# Patient Record
Sex: Male | Born: 1950 | Race: White | Hispanic: No | Marital: Married | Smoking: Never smoker
Health system: Southern US, Community
[De-identification: ages and names within clinical notes are randomized; demographics above are authoritative.]

## PROBLEM LIST (undated history)

## (undated) ENCOUNTER — Emergency Department (HOSPITAL_COMMUNITY): Admission: EM | Discharge status: Absent without leave | Payer: Managed Care, Other (non HMO)

## (undated) DIAGNOSIS — R55 Syncope and collapse: Secondary | ICD-10-CM

## (undated) DIAGNOSIS — K589 Irritable bowel syndrome without diarrhea: Secondary | ICD-10-CM

## (undated) HISTORY — PX: NO PAST SURGERIES: SHX2092

---

## 1998-05-11 ENCOUNTER — Ambulatory Visit: Admission: RE | Admit: 1998-05-11 | Discharge: 1998-05-11 | Payer: Self-pay | Admitting: Family Medicine

## 2004-11-03 ENCOUNTER — Ambulatory Visit (HOSPITAL_COMMUNITY): Admission: RE | Admit: 2004-11-03 | Discharge: 2004-11-03 | Payer: Self-pay | Admitting: Family Medicine

## 2014-08-24 ENCOUNTER — Inpatient Hospital Stay (HOSPITAL_COMMUNITY)
Admission: EM | Admit: 2014-08-24 | Discharge: 2014-08-31 | DRG: 083 | Disposition: A | Discharge status: Home / Self Care | Payer: Managed Care, Other (non HMO) | Attending: Neurosurgery | Admitting: Neurosurgery

## 2014-08-24 ENCOUNTER — Emergency Department (HOSPITAL_COMMUNITY): Payer: Managed Care, Other (non HMO)

## 2014-08-24 DIAGNOSIS — E876 Hypokalemia: Secondary | ICD-10-CM | POA: Diagnosis not present

## 2014-08-24 DIAGNOSIS — R001 Bradycardia, unspecified: Secondary | ICD-10-CM | POA: Diagnosis present

## 2014-08-24 DIAGNOSIS — R55 Syncope and collapse: Secondary | ICD-10-CM

## 2014-08-24 DIAGNOSIS — Y9259 Other trade areas as the place of occurrence of the external cause: Secondary | ICD-10-CM

## 2014-08-24 DIAGNOSIS — K589 Irritable bowel syndrome without diarrhea: Secondary | ICD-10-CM | POA: Diagnosis present

## 2014-08-24 DIAGNOSIS — Z7982 Long term (current) use of aspirin: Secondary | ICD-10-CM | POA: Diagnosis not present

## 2014-08-24 DIAGNOSIS — E871 Hypo-osmolality and hyponatremia: Secondary | ICD-10-CM | POA: Diagnosis present

## 2014-08-24 DIAGNOSIS — S0291XA Unspecified fracture of skull, initial encounter for closed fracture: Secondary | ICD-10-CM | POA: Diagnosis present

## 2014-08-24 DIAGNOSIS — S065X9A Traumatic subdural hemorrhage with loss of consciousness of unspecified duration, initial encounter: Principal | ICD-10-CM | POA: Diagnosis present

## 2014-08-24 DIAGNOSIS — I442 Atrioventricular block, complete: Secondary | ICD-10-CM | POA: Insufficient documentation

## 2014-08-24 DIAGNOSIS — D696 Thrombocytopenia, unspecified: Secondary | ICD-10-CM | POA: Diagnosis present

## 2014-08-24 DIAGNOSIS — S065XAA Traumatic subdural hemorrhage with loss of consciousness status unknown, initial encounter: Secondary | ICD-10-CM

## 2014-08-24 DIAGNOSIS — Z681 Body mass index (BMI) 19 or less, adult: Secondary | ICD-10-CM | POA: Diagnosis not present

## 2014-08-24 DIAGNOSIS — R64 Cachexia: Secondary | ICD-10-CM | POA: Diagnosis not present

## 2014-08-24 DIAGNOSIS — S064XAA Epidural hemorrhage with loss of consciousness status unknown, initial encounter: Secondary | ICD-10-CM

## 2014-08-24 DIAGNOSIS — S069X9A Unspecified intracranial injury with loss of consciousness of unspecified duration, initial encounter: Secondary | ICD-10-CM

## 2014-08-24 DIAGNOSIS — S0990XA Unspecified injury of head, initial encounter: Secondary | ICD-10-CM

## 2014-08-24 DIAGNOSIS — S064X9A Epidural hemorrhage with loss of consciousness of unspecified duration, initial encounter: Secondary | ICD-10-CM

## 2014-08-24 DIAGNOSIS — W19XXXA Unspecified fall, initial encounter: Secondary | ICD-10-CM | POA: Diagnosis present

## 2014-08-24 HISTORY — DX: Syncope and collapse: R55

## 2014-08-24 HISTORY — DX: Irritable bowel syndrome without diarrhea: K58.9

## 2014-08-24 LAB — URINALYSIS, ROUTINE W REFLEX MICROSCOPIC
BILIRUBIN URINE: NEGATIVE
Glucose, UA: NEGATIVE mg/dL
HGB URINE DIPSTICK: NEGATIVE
Ketones, ur: 15 mg/dL — AB
Leukocytes, UA: NEGATIVE
Nitrite: NEGATIVE
Protein, ur: NEGATIVE mg/dL
Specific Gravity, Urine: 1.019 (ref 1.005–1.030)
UROBILINOGEN UA: 1 mg/dL (ref 0.0–1.0)
pH: 6 (ref 5.0–8.0)

## 2014-08-24 LAB — I-STAT TROPONIN, ED: TROPONIN I, POC: 0.01 ng/mL (ref 0.00–0.08)

## 2014-08-24 LAB — COMPREHENSIVE METABOLIC PANEL
ALT: 29 U/L (ref 0–53)
AST: 37 U/L (ref 0–37)
Albumin: 3.7 g/dL (ref 3.5–5.2)
Alkaline Phosphatase: 53 U/L (ref 39–117)
Anion gap: 12 (ref 5–15)
BUN: 11 mg/dL (ref 6–23)
CO2: 23 mmol/L (ref 19–32)
Calcium: 8 mg/dL — ABNORMAL LOW (ref 8.4–10.5)
Chloride: 96 mmol/L (ref 96–112)
Creatinine, Ser: 1.06 mg/dL (ref 0.50–1.35)
GFR calc Af Amer: 86 mL/min — ABNORMAL LOW (ref >90)
GFR calc non Af Amer: 74 mL/min — ABNORMAL LOW (ref >90)
Glucose, Bld: 177 mg/dL — ABNORMAL HIGH (ref 70–99)
Potassium: 3.8 mmol/L (ref 3.5–5.1)
Sodium: 131 mmol/L — ABNORMAL LOW (ref 135–145)
Total Bilirubin: 1.1 mg/dL (ref 0.3–1.2)
Total Protein: 6.2 g/dL (ref 6.0–8.3)

## 2014-08-24 LAB — I-STAT CG4 LACTIC ACID, ED: Lactic Acid, Venous: 2.1 mmol/L (ref 0.5–2.0)

## 2014-08-24 LAB — I-STAT CHEM 8, ED
BUN: 15 mg/dL (ref 6–23)
CALCIUM ION: 1 mmol/L — AB (ref 1.13–1.30)
CHLORIDE: 96 mmol/L (ref 96–112)
Creatinine, Ser: 1.1 mg/dL (ref 0.50–1.35)
GLUCOSE: 174 mg/dL — AB (ref 70–99)
HCT: 44 % (ref 39.0–52.0)
Hemoglobin: 15 g/dL (ref 13.0–17.0)
Potassium: 4.4 mmol/L (ref 3.5–5.1)
Sodium: 133 mmol/L — ABNORMAL LOW (ref 135–145)
TCO2: 22 mmol/L (ref 0–100)

## 2014-08-24 LAB — CBC WITH DIFFERENTIAL/PLATELET
BASOS PCT: 0 % (ref 0–1)
Basophils Absolute: 0 10*3/uL (ref 0.0–0.1)
EOS ABS: 0 10*3/uL (ref 0.0–0.7)
EOS PCT: 0 % (ref 0–5)
HEMATOCRIT: 42.2 % (ref 39.0–52.0)
HEMOGLOBIN: 14.9 g/dL (ref 13.0–17.0)
Lymphocytes Relative: 14 % (ref 12–46)
Lymphs Abs: 1.1 10*3/uL (ref 0.7–4.0)
MCH: 30.3 pg (ref 26.0–34.0)
MCHC: 35.3 g/dL (ref 30.0–36.0)
MCV: 85.8 fL (ref 78.0–100.0)
MONO ABS: 1 10*3/uL (ref 0.1–1.0)
MONOS PCT: 13 % — AB (ref 3–12)
Neutro Abs: 5.6 10*3/uL (ref 1.7–7.7)
Neutrophils Relative %: 73 % (ref 43–77)
Platelets: 128 10*3/uL — ABNORMAL LOW (ref 150–400)
RBC: 4.92 MIL/uL (ref 4.22–5.81)
RDW: 12.7 % (ref 11.5–15.5)
WBC: 7.8 10*3/uL (ref 4.0–10.5)

## 2014-08-24 LAB — CBG MONITORING, ED: Glucose-Capillary: 166 mg/dL — ABNORMAL HIGH (ref 70–99)

## 2014-08-24 LAB — PHENOBARBITAL LEVEL: Phenobarbital: <5 ug/mL — ABNORMAL LOW (ref 15.0–40.0)

## 2014-08-24 LAB — RAPID URINE DRUG SCREEN, HOSP PERFORMED
AMPHETAMINES: NOT DETECTED
Barbiturates: NOT DETECTED
Benzodiazepines: NOT DETECTED
Cocaine: NOT DETECTED
Opiates: NOT DETECTED
TETRAHYDROCANNABINOL: NOT DETECTED

## 2014-08-24 LAB — LIPASE, BLOOD: Lipase: 33 U/L (ref 11–59)

## 2014-08-24 LAB — PHENYTOIN LEVEL, TOTAL: Phenytoin Lvl: <2.5 ug/mL — ABNORMAL LOW (ref 10.0–20.0)

## 2014-08-24 LAB — VALPROIC ACID LEVEL: Valproic Acid Lvl: <10 ug/mL — ABNORMAL LOW (ref 50.0–100.0)

## 2014-08-24 LAB — MRSA PCR SCREENING: MRSA by PCR: NEGATIVE

## 2014-08-24 LAB — ETHANOL: Alcohol, Ethyl (B): <5 mg/dL (ref 0–9)

## 2014-08-24 LAB — PROTIME-INR
INR: 1.16 (ref 0.00–1.49)
Prothrombin Time: 14.9 seconds (ref 11.6–15.2)

## 2014-08-24 LAB — CARBAMAZEPINE LEVEL, TOTAL: Carbamazepine Lvl: <2 ug/mL — ABNORMAL LOW (ref 4.0–12.0)

## 2014-08-24 LAB — MAGNESIUM: Magnesium: 1.8 mg/dL (ref 1.5–2.5)

## 2014-08-24 MED ORDER — ONDANSETRON HCL 4 MG/2ML IJ SOLN
INTRAMUSCULAR | Status: AC
Start: 2014-08-24 — End: 2014-08-24
  Filled 2014-08-24: qty 2

## 2014-08-24 MED ORDER — SODIUM CHLORIDE 0.9 % IV SOLN
INTRAVENOUS | Status: DC
  Administered 2014-08-24 – 2014-08-29 (×6): via INTRAVENOUS

## 2014-08-24 MED ORDER — ONDANSETRON HCL 4 MG/2ML IJ SOLN: 4.0000 mg | Freq: Once | INTRAMUSCULAR | Status: DC

## 2014-08-24 MED ORDER — ONDANSETRON HCL 4 MG/2ML IJ SOLN
4.0000 mg | Freq: Once | INTRAMUSCULAR | Status: AC
  Administered 2014-08-24: 4 mg via INTRAVENOUS

## 2014-08-24 MED ORDER — ONDANSETRON HCL 4 MG/2ML IJ SOLN: 4.0000 mg | Freq: Four times a day (QID) | INTRAMUSCULAR | Status: DC | PRN

## 2014-08-24 MED ORDER — SODIUM CHLORIDE 0.9 % IV BOLUS (SEPSIS)
1000.0000 mL | Freq: Once | INTRAVENOUS | Status: AC
  Administered 2014-08-24: 1000 mL via INTRAVENOUS

## 2014-08-24 NOTE — ED Notes (Signed)
Per EMS, pt was checking out of his hotel, when went unresponsive while checking out. Hotel staff states that the pt fel backwards and hit his head, and that he was unresponsive for 2-3 minutes. Per EMS, pt has a hematoma to the back of his head, and with opens his eyes to his name being called. CBG- 135

## 2014-08-24 NOTE — H&P (Signed)
NAMTomasa Blase:  [name], [name]                ACCOUNT NO.:  [id number]  MEDICAL RECORD NO.:  [id number]  LOCATION:  A09C                         FACILITY:  [facility]  PHYSICIAN:  Hilda LiasErnesto Lindaann [name], M.D.   DATE OF BIRTH:  1950/12/03  DATE OF ADMISSION:  08/24/2014 DATE OF DISCHARGE:                             HISTORY & PHYSICAL   The patient was seen in the emergency room.  HISTORY OF PRESENT ILLNESS:  [name] is a 64 years old gentleman who was checking out this morning from his hotel; he fell and hit his head. Immediately, he complained of headache.  He was transferred to the emergency room.  By the time he came to the emergency room, according to the ER doctor, he was awake, talking, following commands.  Because of the finding, they decided to go ahead with a CT scan of the head, which showed multiple areas of brain injury including a small subdural hematoma, a small epidural hematoma, and fracture in the vertex of the skull.  I was called for evaluation.  By the time I saw him, he was nauseated and he was off and on following commands, moving all the 4 extremities.  I reviewed the CT scan; and because of the finding, he is being admitted immediately to the hospital.  PAST MEDICAL HISTORY:  Unavailable.  REVIEW OF SYSTEMS:  I cannot get any information properly from the patient.  FAMILY HISTORY:  Unremarkable.  MEDICATIONS:  We do not know what medications he is taking.  PHYSICAL EXAMINATION:  VITAL SIGNS:  The blood pressure is 146/60.  The pulse is 84. HEAD, EYES, EARS, NOSE, AND THROAT:  There is no evidence of any CSF coming from the nose or from the ear.  There is some tenderness in the scalp all over.  He has a bilateral raccoon eyes. NECK:  He is in a brace. CARDIOVASCULAR:  Normal. LUNGS:  Clear. ABDOMEN:  Normal. EXTREMITIES:  Normal. NEUROLOGICAL:  Mental status; he is awake, he is obviously in the high. He does not quite follow commands well.  Cranial nerves, the pupils  are 3 mm, equal, reactive.  The face is symmetrical.  He has bilateral raccoon eyes.  Again, there is no evidence of any blood or CSF coming from the ears or nose.  Strength, he moves all the 4 extremities. Sensation, he withdraws to pain.  The CT scan showed a depressed skull fracture high in the vertex of the skull with 5 mm depressions right in the midline of the sagittal sinus.  He has a contusion with small epidural hematoma, frontal.  There is some contusion on the temporal area.  CLINICAL IMPRESSION:  Closed head injury with fracture of the skull.  A small epidural hematoma and probably subdural hematoma in the parietal area.  RECOMMENDATIONS:  The patient is being admitted immediately to the intensive care unit.  We are going to repeat the CT scan in the next 2-3 hours.  In the meantime, we are going to stay away from any intubation. I reviewed the CT scan with [name].  We fully agree with the approach. I am going to be talking to [name], with the neurosurgeon call  for the weekend.  I am going to be going out of town and I am going to leave Dr. Newell [name], my partner to take over [name] care.          ______________________________ Hilda Lias, M.D.     EB/MEDQ  D:  08/24/2014  T:  08/24/2014  Job:  [id number]

## 2014-08-24 NOTE — ED Notes (Signed)
Neurosurgeon at bedside °

## 2014-08-24 NOTE — ED Provider Notes (Signed)
CSN: [id number]     Arrival date & time 08/24/14  6962 History   First MD Initiated Contact with Patient 08/24/14 0532     Chief Complaint  Patient presents with  . Loss of Consciousness     (Consider location/radiation/quality/duration/timing/severity/associated sxs/prior Treatment) HPI Patient presents to the emergency department following a syncopal event where he fell backwards hitting his head.  Patient was unresponsive for 2-3 minutes per EMS report.  Patient has been lethargic and slow to answer since that time.  The patient will respond but does not give much of a history to me No past medical history on file. No past surgical history on file. No family history on file. History  Substance Use Topics  . Smoking status: Not on file  . Smokeless tobacco: Not on file  . Alcohol Use: Not on file    Review of Systems  Level V caveat applies due to significant illness and cannot give me and accurate history  Allergies  Review of patient's allergies indicates no known allergies.  Home Medications   Prior to Admission medications   Not on File   BP 146/61 mmHg  Pulse 72  Temp(Src) 98.7 F (37.1 C) (Oral)  Resp 22  SpO2 96% Physical Exam  Constitutional: He appears well-developed and well-nourished. No distress.  HENT:  Head: Normocephalic. Head is with raccoon's eyes.    Mouth/Throat: Oropharynx is clear and moist.  Eyes: Pupils are equal, round, and reactive to light.  Cardiovascular: Normal rate, regular rhythm and normal heart sounds.  Exam reveals no gallop and no friction rub.   No murmur heard. Pulmonary/Chest: Effort normal and breath sounds normal. No respiratory distress.  Neurological: He is alert. No cranial nerve deficit. He exhibits normal muscle tone. Coordination normal. GCS eye subscore is 3. GCS verbal subscore is 4. GCS motor subscore is 6.  Skin: Skin is warm and dry.  Nursing note and vitals reviewed.   ED Course  Procedures (including  critical care time) Labs Review Labs Reviewed  CBC WITH DIFFERENTIAL/PLATELET - Abnormal; Notable for the following:    Platelets 128 (*)    Monocytes Relative 13 (*)    All other components within normal limits  URINALYSIS, ROUTINE W REFLEX MICROSCOPIC - Abnormal; Notable for the following:    Ketones, ur 15 (*)    All other components within normal limits  I-STAT CHEM 8, ED - Abnormal; Notable for the following:    Sodium 133 (*)    Glucose, Bld 174 (*)    Calcium, Ion 1.00 (*)    All other components within normal limits  I-STAT CG4 LACTIC ACID, ED - Abnormal; Notable for the following:    Lactic Acid, Venous 2.10 (*)    All other components within normal limits  URINE RAPID DRUG SCREEN (HOSP PERFORMED)  COMPREHENSIVE METABOLIC PANEL  LIPASE, BLOOD  PROTIME-INR  ETHANOL  MAGNESIUM  PHENOBARBITAL LEVEL  PHENYTOIN LEVEL, TOTAL  VALPROIC ACID LEVEL  CARBAMAZEPINE LEVEL, TOTAL  CBG MONITORING, ED  I-STAT TROPOININ, ED    Imaging Review Dg Chest 2 View  08/24/2014   CLINICAL DATA:  Altered mental status/syncope  EXAM: CHEST  2 VIEW  COMPARISON:  None.  FINDINGS: There is a monitor lead overlying a portion of the right midchest. There is opacity in this area presumably from the monitor lead. Apart from this area, lungs are clear. Heart size and pulmonary vascularity are normal. No adenopathy. No pneumothorax. No bone lesions.  IMPRESSION: There is a monitor lead  in the mid right hemithorax area which is felt to lead to the opacity in this area. A small area of infiltrate in this area conceivably could be obscured by the monitor lead. Beyond this small area, the lungs are clear.   Electronically Signed   By: Bretta BangWilliam  Woodruff III M.D.   On: 08/24/2014 07:04   Ct Head Wo Contrast  08/24/2014   CLINICAL DATA:  Syncope and fall.  EXAM: CT HEAD WITHOUT CONTRAST  CT CERVICAL SPINE WITHOUT CONTRAST  TECHNIQUE: Multidetector CT imaging of the head and cervical spine was performed following  the standard protocol without intravenous contrast. Multiplanar CT image reconstructions of the cervical spine were also generated.  COMPARISON:  None.  FINDINGS: CT HEAD FINDINGS  Skull and Sinuses:There is a complex fracture of the calvarium, with a triangular fracture in the upper occipital bone that is depressed up to 6 mm. This is over the superior sagittal sinus. Fractures continued into the bilateral parietal bones ending on the left in the frontal suture which is mildly diastatic. Inferior continuation in the into the left squamosal temporal bone, presumably reaching the mastoid temporal bone given left mastoid effusion. No ossicular chain or otic capsule disruption. Pneumocephalus present. Question nondisplaced fractures of the left orbital roof.  Orbits: No acute abnormality.  Brain: There is hematoma at the vertex neighboring the depressed fracture consistent with venous epidural hematoma. Scattered bilateral subarachnoid hemorrhage, most thick around the left frontal pole were there is also hemorrhagic contusion and probable subdural blood. There is small hemorrhagic contusion in the left temporal lobe inferiorly along tegmen mastoideum. Suspect small hemorrhagic contusion in the inferior right frontal lobe.  No hydrocephalus, shift, or herniation.  No evidence of infarct.  CT CERVICAL SPINE FINDINGS  Negative for acute fracture or subluxation. No prevertebral edema. No gross cervical canal hematoma. No significant osseous canal or foraminal stenosis.  Critical Value/emergent results were called by telephone at the time of interpretation on 08/24/2014 at 6:33 am to Dr. Tomasita CrumbleADELEKE ONI , who verbally acknowledged these results.  IMPRESSION: 1. Complex skull fracture with occipital fragment depression over the superior sagittal sinus by 6 mm. There is underlying venous epidural hemorrhage. 2. Hemorrhagic contusions in the inferior frontal and inferior left temporal lobes. 3. Scattered subarachnoid and subdural  hemorrhage without shift or herniation. 4. Calvarial fractures continue into the left mastoid temporal bone. Suspect nondisplaced fracture of the left orbital roof. 5. Pneumocephalus. 6. Negative cervical spine.   Electronically Signed   By: Marnee SpringJonathon  Watts M.D.   On: 08/24/2014 06:41   Ct Cervical Spine Wo Contrast  08/24/2014   CLINICAL DATA:  Syncope and fall.  EXAM: CT HEAD WITHOUT CONTRAST  CT CERVICAL SPINE WITHOUT CONTRAST  TECHNIQUE: Multidetector CT imaging of the head and cervical spine was performed following the standard protocol without intravenous contrast. Multiplanar CT image reconstructions of the cervical spine were also generated.  COMPARISON:  None.  FINDINGS: CT HEAD FINDINGS  Skull and Sinuses:There is a complex fracture of the calvarium, with a triangular fracture in the upper occipital bone that is depressed up to 6 mm. This is over the superior sagittal sinus. Fractures continued into the bilateral parietal bones ending on the left in the frontal suture which is mildly diastatic. Inferior continuation in the into the left squamosal temporal bone, presumably reaching the mastoid temporal bone given left mastoid effusion. No ossicular chain or otic capsule disruption. Pneumocephalus present. Question nondisplaced fractures of the left orbital roof.  Orbits: No acute  abnormality.  Brain: There is hematoma at the vertex neighboring the depressed fracture consistent with venous epidural hematoma. Scattered bilateral subarachnoid hemorrhage, most thick around the left frontal pole were there is also hemorrhagic contusion and probable subdural blood. There is small hemorrhagic contusion in the left temporal lobe inferiorly along tegmen mastoideum. Suspect small hemorrhagic contusion in the inferior right frontal lobe.  No hydrocephalus, shift, or herniation.  No evidence of infarct.  CT CERVICAL SPINE FINDINGS  Negative for acute fracture or subluxation. No prevertebral edema. No gross cervical  canal hematoma. No significant osseous canal or foraminal stenosis.  Critical Value/emergent results were called by telephone at the time of interpretation on 08/24/2014 at 6:33 am to Dr. Tomasita Crumble , who verbally acknowledged these results.  IMPRESSION: 1. Complex skull fracture with occipital fragment depression over the superior sagittal sinus by 6 mm. There is underlying venous epidural hemorrhage. 2. Hemorrhagic contusions in the inferior frontal and inferior left temporal lobes. 3. Scattered subarachnoid and subdural hemorrhage without shift or herniation. 4. Calvarial fractures continue into the left mastoid temporal bone. Suspect nondisplaced fracture of the left orbital roof. 5. Pneumocephalus. 6. Negative cervical spine.   Electronically Signed   By: Marnee Spring M.D.   On: 08/24/2014 06:41     EKG Interpretation   Date/Time:  Friday August 24 2014 05:36:23 EST Ventricular Rate:  74 PR Interval:  166 QRS Duration: 94 QT Interval:  361 QTC Calculation: 400 R Axis:   70 Text Interpretation:  Sinus rhythm Confirmed by Erroll Luna  [phone]) on 08/24/2014 5:43:57 AM      CRITICAL CARE Performed by: Carlyle Dolly Total critical care time: 50 minutes Critical care time was exclusive of separately billable procedures and treating other patients. Critical care was necessary to treat or prevent imminent or life-threatening deterioration. Critical care was time spent personally by me on the following activities: development of treatment plan with patient and/or surrogate as well as nursing, discussions with consultants, evaluation of patient's response to treatment, examination of patient, obtaining history from patient or surrogate, ordering and performing treatments and interventions, ordering and review of laboratory studies, ordering and review of radiographic studies, pulse oximetry and re-evaluation of patient's condition.   Have rechecked the patient.  5.  He is  maintaining his airway and is still able to follow simple commands.  Neurosurgery has been called   I feel that based on some of the injuries noted on CT scan that the patient must have had a fall prior to the episode in the hotel lobby, as he has a orbital fracture and some frontal fractures noted.  The posterior skull fracture seems related to the fall this morning.  Jamesetta Orleans Jartavious Mckimmy, PA-C 08/24/14 1605  Tomasita Crumble, MD 08/24/14 2326

## 2014-08-24 NOTE — ED Notes (Signed)
Unable to ask screening questions. Pt is extensively non-verbal, only answering simple questions and following simple commands.

## 2014-08-24 NOTE — Progress Notes (Signed)
Patient ID: [name], male   DOB: Nov 29, 1950, 64 y.o.   MRN: [id number] Stable. Open eyes. Talks some. He tells me he is from [place], speaks spanish and lives in [place]. Can not locate family

## 2014-08-24 NOTE — Progress Notes (Signed)
Patient ID: Villa HerbStefan [name], male   DOB: 08-02-1950, 64 y.o.   MRN: [id number] Satble. Moves all 4 extremities. Family coming from [place]. Dr Newell CoralNudelman is aware of [name]. i showed him the ct and expl;ained the case. He and my partners are going to help me till i come back in 10 days.

## 2014-08-24 NOTE — Clinical Social Work Note (Signed)
Clinical Social Worker and RN spoke with patient at bedside to confirm contact with patient wife.  Patient did provide permission to contact his wife and to use his phone to retrieve her number.  CSW spoke with patient wife to notify of patient hospitalization and got password to facilitate further conversation with RN.  Per RN, patient daughter has also called and they are planning to come from [place] as soon as possible.  CSW available if needed.  Terry GoldsJesse Luetta Piazza, [place] [phone]

## 2014-08-24 NOTE — ED Provider Notes (Signed)
07:10 I have assumed care for the patient at shift change for Dr. Phoebe Perchney. The history is that the patient was checking out of his hotel and suddenly fell backwards hitting his head. Reportedly there was very limited ancillary history regarding the patient. At this time Dr. Lottie Musselnie has consult to neurosurgery and patient has both subdural as well as epidural hematomas. His mental status has been oriented 1 and following simple commands. His respiratory status has been stable. At this time he is awaiting neurosurgical management. I have reassessed the patient and his vital signs are stable with normotensive blood pressures heart rates in the 60s to 70s and oxygen saturation 97%. The patient is breathing at a regular rate without any evidence of difficulty handling secretions. There are no sonorous respirations. He has obvious periorbital hematomas developing. Pupils are symmetric and midrange and responsive to light. The patient can open his eyes to command. I placed my hands within his hands and ask him to perform grip strength he will perform grip and as I ask him to release he will release. Skin is warm and dry.  07:40 and was called to the room by nursing for an episode of vomiting. The patient had dry heaves and was rolled to his side. There is no evidence at this time of any aspiration. The patient did not appear to produce any actual emesis. There was no change in vital signs. Dr. Jeral FruitBotero was in the emergency department at that time. Pupils were rechecked and found to still be symmetric. At this time Dr. Jeral FruitBotero recommends against intubation and will continue to monitor the patient's neurologic status in the ICU. Once this episode of dry heaves past patient was again sleeping with stable vital signs. There is no pooling of secretions and no sonorous respirations.  Arby BarretteMarcy Cache Decoursey, MD 08/24/14 [phone]

## 2014-08-25 ENCOUNTER — Encounter (HOSPITAL_COMMUNITY): Payer: Self-pay

## 2014-08-25 ENCOUNTER — Inpatient Hospital Stay (HOSPITAL_COMMUNITY): Payer: Managed Care, Other (non HMO)

## 2014-08-25 DIAGNOSIS — R55 Syncope and collapse: Secondary | ICD-10-CM

## 2014-08-25 LAB — BASIC METABOLIC PANEL
Anion gap: 9 (ref 5–15)
BUN: 16 mg/dL (ref 6–23)
CO2: 26 mmol/L (ref 19–32)
Calcium: 7.7 mg/dL — ABNORMAL LOW (ref 8.4–10.5)
Chloride: 98 mmol/L (ref 96–112)
Creatinine, Ser: 0.72 mg/dL (ref 0.50–1.35)
GFR calc Af Amer: >90 mL/min (ref >90)
GFR calc non Af Amer: >90 mL/min (ref >90)
Glucose, Bld: 137 mg/dL — ABNORMAL HIGH (ref 70–99)
Potassium: 3.8 mmol/L (ref 3.5–5.1)
Sodium: 133 mmol/L — ABNORMAL LOW (ref 135–145)

## 2014-08-25 LAB — CBC WITH DIFFERENTIAL/PLATELET
Basophils Absolute: 0 10*3/uL (ref 0.0–0.1)
Basophils Relative: 0 % (ref 0–1)
Eosinophils Absolute: 0 10*3/uL (ref 0.0–0.7)
Eosinophils Relative: 0 % (ref 0–5)
HCT: 38.3 % — ABNORMAL LOW (ref 39.0–52.0)
Hemoglobin: 13.8 g/dL (ref 13.0–17.0)
Lymphocytes Relative: 10 % — ABNORMAL LOW (ref 12–46)
Lymphs Abs: 0.7 10*3/uL (ref 0.7–4.0)
MCH: 30 pg (ref 26.0–34.0)
MCHC: 36 g/dL (ref 30.0–36.0)
MCV: 83.3 fL (ref 78.0–100.0)
Monocytes Absolute: 0.8 10*3/uL (ref 0.1–1.0)
Monocytes Relative: 11 % (ref 3–12)
Neutro Abs: 5.5 10*3/uL (ref 1.7–7.7)
Neutrophils Relative %: 79 % — ABNORMAL HIGH (ref 43–77)
Platelets: 98 10*3/uL — ABNORMAL LOW (ref 150–400)
RBC: 4.6 MIL/uL (ref 4.22–5.81)
RDW: 12.5 % (ref 11.5–15.5)
WBC: 7 10*3/uL (ref 4.0–10.5)

## 2014-08-25 MED ORDER — WHITE PETROLATUM GEL
Status: AC
  Administered 2014-08-25: 0.2
  Filled 2014-08-25: qty 1

## 2014-08-25 NOTE — Consult Note (Signed)
ELECTROPHYSIOLOGY CONSULT NOTE  Patient ID: [name], MRN: [id number], DOB/AGE: 62-Jul-1952 62 y.o. Admit date: 08/24/2014 Date of Consult: 08/25/2014  Primary Physician: No primary care provider on file. Primary Cardiologist: [place]  Chief Complaint: syncoep   HPI Terry HerbStefan Darroch is a 64 y.o. male  We are asked to see for syncope. This is been a recurring problem over about 20 years although the episodes have been infrequent.  There was an episode about 20 years ago in [place] that occurred in the context of nausea vomiting and diarrhea. He is unaware of the symptoms that were present in recovery.  A year and a half ago he had another episode. He had some dizziness that morning. He was sitting next to his wife at the table. He lost consciousness without warning. He was unconscious, probably 15-30 seconds. He was noted to be quite pale. There was residual orthostatic intolerance as she helped him get from the table to the bedroom where he passed out again in the context of more nausea and vomiting. EMS was called and upon their arrival vital signs were apparently normal.  The most recent episode occurred without warning. He had a little bit of dizziness in the morning. He had checked out of his hotel. He is walking across the lobby and he collapsed without warning. There was head trauma and intracranial imaging demonstrated bleeding. He has had bradycardia this morning with sinus rates in the high 30s and low 40s. And we are asked to see him.  He's had no problems with exercise intolerance.  [facility] He had a negative neurological evaluation. His cardiac evaluation is not outlined except that he had a full table test which was described as "very positive". His wife said that this occurred in the context of nitroglycerin.        History reviewed. No pertinent past medical history.    Surgical  History: No past surgical history on file.   Home Meds: Prior to Admission medications   Medication Sig Start Date End Date Taking? Authorizing Provider  aspirin EC 81 MG tablet Take 81 mg by mouth daily.   Yes Historical Provider, MD  dicyclomine (BENTYL) 20 MG tablet Take 20 mg by mouth every 6 (six) hours.   Yes Historical Provider, MD     Allergies: No Known Allergies  History   Social History  . Marital Status: Married    Spouse Name: N/A  . Number of Children: N/A  . Years of Education: N/A   Occupational History  . Not on file.   Social History Main Topics  . Smoking status: Not on file  . Smokeless tobacco: Not on file  . Alcohol Use: Not on file  . Drug Use: Not on file  . Sexual Activity: Not on file   Other Topics Concern  . Not on file   Social History Narrative  . No narrative on file    Fhx notable ofor PE   ROS:  Please see the history of present illness.     All other systems reviewed and negative.    Physical Exam:   Blood pressure 138/72, pulse 62, temperature 98.5 F (36.9 C), temperature source Axillary, resp. rate 19, SpO2 99 %. General: Well developed, well nourished male in no acute distress. Head: Normocephalic, atraumatic, sclera non-icteric, no xanthomas, nares are without discharge. EENT: normal ecchymosis Lymph Nodes:  none Back: without scoliosis/kyphosis*  no  CVA tendersness Neck: Negative for carotid bruits. JVD not elevated. Lungs: Clear bilaterally to auscultation without wheezes, rales, or rhonchi. Breathing is unlabored. Heart: RRR with S1 S2. No  murmur , rubs, or gallops appreciated. Abdomen: Soft, non-tender, non-distended with normoactive bowel sounds. No hepatomegaly. No rebound/guarding. No obvious abdominal masses. Msk:  Strength and tone appear normal for age. Extremities: No clubbing or cyanosis. No  edema.  Distal pedal pulses are 2+ and equal bilaterally. Skin: Warm and Dry Neuro: Alert and oriented X 3. CN III-XII  intact Grossly normal sensory and motor function . Psych:  Responds to questions appropriately with a normal affect.      Labs: Cardiac Enzymes No results for input(s): CKTOTAL, CKMB, TROPONINI in the last 72 hours. CBC Lab Results  Component Value Date   WBC 7.0 08/25/2014   HGB 13.8 08/25/2014   HCT 38.3* 08/25/2014   MCV 83.3 08/25/2014   PLT 98* 08/25/2014   PROTIME:  Recent Labs  08/24/14 0630  LABPROT 14.9  INR 1.16   Chemistry  Recent Labs Lab 08/24/14 0808 08/25/14 1040  NA 131* 133*  K 3.8 3.8  CL 96 98  CO2 23 26  BUN 11 16  CREATININE 1.06 0.72  CALCIUM 8.0* 7.7*  PROT 6.2  --   BILITOT 1.1  --   ALKPHOS 53  --   ALT 29  --   AST 37  --   GLUCOSE 177* 137*   Lipids No results found for: CHOL, HDL, LDLCALC, TRIG BNP No results found for: PROBNP Miscellaneous No results found for: DDIMER  Radiology/Studies:  Dg Chest 2 View  08/24/2014   CLINICAL DATA:  Altered mental status/syncope  EXAM: CHEST  2 VIEW  COMPARISON:  None.  FINDINGS: There is a monitor lead overlying a portion of the right midchest. There is opacity in this area presumably from the monitor lead. Apart from this area, lungs are clear. Heart size and pulmonary vascularity are normal. No adenopathy. No pneumothorax. No bone lesions.  IMPRESSION: There is a monitor lead in the mid right hemithorax area which is felt to lead to the opacity in this area. A small area of infiltrate in this area conceivably could be obscured by the monitor lead. Beyond this small area, the lungs are clear.   Electronically Signed   By: Bretta Bang III M.D.   On: 08/24/2014 07:04   Ct Head Wo Contrast  08/25/2014   CLINICAL DATA:  Head injury with skull fracture.  Initial encounter.  EXAM: CT HEAD WITHOUT CONTRAST  TECHNIQUE: Contiguous axial images were obtained from the base of the skull through the vertex without intravenous contrast.  COMPARISON:  08/24/2014  FINDINGS: Complex parieto-occipital skull  fracture is again seen with approximately 6 mm depression of a central fragment in the midline and suspected extension to the mastoid portion of the left temporal bone, unchanged. Underlying epidural hematoma does not appear significantly changed, greatest near the vertex.  There is a new, small amount of acute subdural hemorrhage along the left posterior falx measuring 6 mm in thickness. A small amount of acute subarachnoid hemorrhage is again noted scattered about the cerebral hemispheres. There is a new 1 cm hemorrhagic contusion in the medial left parietal lobe subjacent to the skull fracture. There is also a new subcentimeter focus of hemorrhage involving the left thalamus/ cerebral peduncle.  Foci of parenchymal hemorrhage in the inferior left frontal lobe have increased in size and number, the largest measuring 2.7 x 1.3  cm with mild surrounding edema. There is overlying subarachnoid hemorrhage and likely small volume subdural hemorrhage, similar to prior. Trace right frontal subdural and/or subarachnoid hemorrhage is unchanged. There is an inferior right frontal lobe contusion with newly evident edema as well as a 9 mm focus of hemorrhage.  Hemorrhage in the left temporal lobe is similar to the prior study with slightly increased edema. There is a new, small amount of hemorrhage in the occipital horn of the right lateral ventricle. There is no hydrocephalus. There is no midline shift. No acute large territory infarct is identified.  A nondisplaced left orbital roof fracture is again questioned. Small amount of left mastoid air cell fluid is unchanged. Right mastoid air cells are clear. Visualized paranasal sinuses are clear. Scalp swelling/ hematoma has mildly increased in size, particularly in the left temporal region.  IMPRESSION: 1. Development of additional hemorrhagic contusions in the inferior left frontal lobe. 2. New, small focus of hemorrhage in the left thalamus/cerebral peduncle. 3. New/increased  hemorrhagic contusion in the inferior right frontal lobe. 4. New hemorrhagic contusion in the left parietal lobe. 5. Small volume subarachnoid hemorrhage, stable to slightly increased, with new, small amount of intraventricular hemorrhage. No hydrocephalus. 6. Slightly increased edema associated with the left temporal lobe hemorrhagic contusion. 7. Unchanged epidural hematoma associated with complex parieto-occipital skull fracture.   Electronically Signed   By: Sebastian Ache   On: 08/25/2014 11:58   Ct Head Wo Contrast  08/24/2014   CLINICAL DATA:  Syncope and fall.  EXAM: CT HEAD WITHOUT CONTRAST  CT CERVICAL SPINE WITHOUT CONTRAST  TECHNIQUE: Multidetector CT imaging of the head and cervical spine was performed following the standard protocol without intravenous contrast. Multiplanar CT image reconstructions of the cervical spine were also generated.  COMPARISON:  None.  FINDINGS: CT HEAD FINDINGS  Skull and Sinuses:There is a complex fracture of the calvarium, with a triangular fracture in the upper occipital bone that is depressed up to 6 mm. This is over the superior sagittal sinus. Fractures continued into the bilateral parietal bones ending on the left in the frontal suture which is mildly diastatic. Inferior continuation in the into the left squamosal temporal bone, presumably reaching the mastoid temporal bone given left mastoid effusion. No ossicular chain or otic capsule disruption. Pneumocephalus present. Question nondisplaced fractures of the left orbital roof.  Orbits: No acute abnormality.  Brain: There is hematoma at the vertex neighboring the depressed fracture consistent with venous epidural hematoma. Scattered bilateral subarachnoid hemorrhage, most thick around the left frontal pole were there is also hemorrhagic contusion and probable subdural blood. There is small hemorrhagic contusion in the left temporal lobe inferiorly along tegmen mastoideum. Suspect small hemorrhagic contusion in the  inferior right frontal lobe.  No hydrocephalus, shift, or herniation.  No evidence of infarct.  CT CERVICAL SPINE FINDINGS  Negative for acute fracture or subluxation. No prevertebral edema. No gross cervical canal hematoma. No significant osseous canal or foraminal stenosis.  Critical Value/emergent results were called by telephone at the time of interpretation on 08/24/2014 at 6:33 am to Dr. Tomasita Crumble , who verbally acknowledged these results.  IMPRESSION: 1. Complex skull fracture with occipital fragment depression over the superior sagittal sinus by 6 mm. There is underlying venous epidural hemorrhage. 2. Hemorrhagic contusions in the inferior frontal and inferior left temporal lobes. 3. Scattered subarachnoid and subdural hemorrhage without shift or herniation. 4. Calvarial fractures continue into the left mastoid temporal bone. Suspect nondisplaced fracture of the left orbital roof.  5. Pneumocephalus. 6. Negative cervical spine.   Electronically Signed   By: Marnee Spring M.D.   On: 08/24/2014 06:41   Ct Cervical Spine Wo Contrast  08/24/2014   CLINICAL DATA:  Syncope and fall.  EXAM: CT HEAD WITHOUT CONTRAST  CT CERVICAL SPINE WITHOUT CONTRAST  TECHNIQUE: Multidetector CT imaging of the head and cervical spine was performed following the standard protocol without intravenous contrast. Multiplanar CT image reconstructions of the cervical spine were also generated.  COMPARISON:  None.  FINDINGS: CT HEAD FINDINGS  Skull and Sinuses:There is a complex fracture of the calvarium, with a triangular fracture in the upper occipital bone that is depressed up to 6 mm. This is over the superior sagittal sinus. Fractures continued into the bilateral parietal bones ending on the left in the frontal suture which is mildly diastatic. Inferior continuation in the into the left squamosal temporal bone, presumably reaching the mastoid temporal bone given left mastoid effusion. No ossicular chain or otic capsule disruption.  Pneumocephalus present. Question nondisplaced fractures of the left orbital roof.  Orbits: No acute abnormality.  Brain: There is hematoma at the vertex neighboring the depressed fracture consistent with venous epidural hematoma. Scattered bilateral subarachnoid hemorrhage, most thick around the left frontal pole were there is also hemorrhagic contusion and probable subdural blood. There is small hemorrhagic contusion in the left temporal lobe inferiorly along tegmen mastoideum. Suspect small hemorrhagic contusion in the inferior right frontal lobe.  No hydrocephalus, shift, or herniation.  No evidence of infarct.  CT CERVICAL SPINE FINDINGS  Negative for acute fracture or subluxation. No prevertebral edema. No gross cervical canal hematoma. No significant osseous canal or foraminal stenosis.  Critical Value/emergent results were called by telephone at the time of interpretation on 08/24/2014 at 6:33 am to Dr. Tomasita Crumble , who verbally acknowledged these results.  IMPRESSION: 1. Complex skull fracture with occipital fragment depression over the superior sagittal sinus by 6 mm. There is underlying venous epidural hemorrhage. 2. Hemorrhagic contusions in the inferior frontal and inferior left temporal lobes. 3. Scattered subarachnoid and subdural hemorrhage without shift or herniation. 4. Calvarial fractures continue into the left mastoid temporal bone. Suspect nondisplaced fracture of the left orbital roof. 5. Pneumocephalus. 6. Negative cervical spine.   Electronically Signed   By: Marnee Spring M.D.   On: 08/24/2014 06:41    EKG: Sinus rhythm at 38-44 with normal intervals2     Assessment and Plan:   Syncope-probably neurally mediated  Sinus bradycardia  Intracranial bleeding secondary to trauma  The patient has a history of recurrent syncope. The episodes have been relatively rapid in onset 1 just seconds and the other without warning at all. The episode 10/14 was associated with residual  orthostatic intolerance descriptions of pallor and accompanying nausea. Evaluation at the [facility] included a tilt table test that was "very positive" for specifics of which are not available. This occurred with nitroglycerin which decreases specificity somewhat and was associated with symptoms of the patient did not recognize also further decreasing specificity.  I don't think his sinus bradycardia is the culprit.  Reviewing the records from [place], no echocardiogram was reported. This should be done to exclude structural heart disease. I recommended that they consider implantable loop recorder placed on the ISSUE 3 data which identified a subset of patients in whom pacing seem to attenuate the risk of recurrent syncope. Monitors were helpful inpatients who had "negative tilt table test" and hence the specifics of the tilt table test  are important in this regard.  The other value of the loop recorder is that given the patient's recurrent sinus bradycardia is possible at there is a primary sinus node defect and that the tilt table test is a false positive for the reasons outlined above.  We had discussed the issue of volume repletion. His blood pressure is borderline elevated so salt intake is not necessarily a great idea. We also discussed the role of isometric contraction    Sherryl Manges

## 2014-08-25 NOTE — Consult Note (Signed)
Reason for Consult: Syncope Referring Physician: Dr. Jeral Fruit  CC: Passed out  HPI: [name] is a 64 y.o. male with a history of 3 or possibly 4 episodes of syncope dating back to 55. Last year he had a workup in [place] where he currently resides. He was seen by cardiology and by neurology. His wife has a notebook with records from that workup. Apparently the main abnormality was a positive tilt table test. The conclusion was that he had some sort of inner ear problem.   The patient works for [employer] and travels extensively. He recently flew to [place] and then back to [place]. He had very little sleep before this most recent event which occurred yesterday while he was leaving his hotel. He does not recall many of the details - the last thing he remembers was checking out of the hotel. Apparently he fell and hit his head with subsequent skull fractures, small subdural hematoma and a small epidural hematoma. He was admitted by Dr. Jeral Fruit and treated conservatively. Neurology and cardiology have been asked to see the patient with regards to his syncope. Since admission patient has had bradycardia with rates as low as the 40s and I believe 30s at one point. The patient states that he is an avid runner and his heart rate tends to run slow. He often feels nauseated before these events. He has history of probable bowel syndrome and feels his syncope might be related. He sometimes feels dizzy but denies chest pain, shortness of breath, palpitations, heart racing, or visual changes. He has had a recent respiratory infection and had taken some TheraFlu.  The patient is from [place] originally and speaks several languages including [place] and [place]. His wife is from [place] and speaks [place] and some [place]. She can be reached at [phone]. His daughter, [name], speaks fluent English and can be reached at [phone].   Past medical history - irritable bowel syndrome otherwise  healthy   No past surgical history on file.  Family history: His mother died from complications of Alzheimer's disease. His father is alive and in his mid 50s. He has a history of DVT and pulmonary emboli   Social History: The patient is married and lives in [place]. He works [employer]. He is originally from [place] and speaks several languages. He has never smoked. He drinks an occasional glass of wine. He doesn't use illegal drugs.  No Known Allergies  Medications:  Current Facility-Administered Medications  Medication Dose Route Frequency Provider Last Rate Last Dose  . 0.9 %  sodium chloride infusion   Intravenous Continuous Karn Cassis, MD 75 mL/hr at 08/25/14 1235    . ondansetron (ZOFRAN) injection 4 mg  4 mg Intravenous Q6H PRN Karn Cassis, MD       Home medications - aspirin 81 mg daily and Bentyl PRN  ROS: History obtained from the patient and wife  General ROS: negative for - chills, fatigue, fever, night sweats, weight gain or weight loss. Recent respiratory tract infection. Psychological ROS: negative for - behavioral disorder, hallucinations, memory difficulties, mood swings or suicidal ideation Ophthalmic ROS: negative for - blurry vision, double vision, eye pain or loss of vision ENT ROS: negative for - epistaxis, nasal discharge, oral lesions, sore throat, tinnitus or vertigo Allergy and Immunology ROS: negative for - hives or itchy/watery eyes Hematological and Lymphatic ROS: negative for - bleeding problems, bruising or swollen lymph nodes Endocrine ROS: negative for - galactorrhea, hair pattern changes, polydipsia/polyuria or temperature intolerance  Respiratory ROS: negative for - cough, hemoptysis, shortness of breath or wheezing Cardiovascular ROS: negative for - chest pain, dyspnea on exertion, edema or irregular heartbeat Gastrointestinal ROS: negative for - abdominal pain, diarrhea, hematemesis,  or stool incontinence. Positive for Irritable  bowel syndrome. Recent nausea and vomiting. Occasional chest discomfort which she attributes to indigestion. Genito-Urinary ROS: negative for - dysuria, hematuria, incontinence or urinary frequency/urgency Musculoskeletal ROS: negative for - joint swelling or muscular weakness Neurological ROS: as noted in HPI Dermatological ROS: negative for rash and skin lesion changes   Physical Examination: Blood pressure 137/67, pulse 65, temperature 98.5 F (36.9 C), temperature source Axillary, resp. rate 21, SpO2 98 %.    General - pleasant 64 year old male with a flat affect and ecchymosis surrounding both eyes. Heart -  Lungs -  Abdomen -  Extremities - Distal pulses intact - no edema Skin - Warm and dry   Neurologic Examination Mental Status: Alert, oriented except for the day of the month. thought content appropriate.  Speech fluent without evidence of aphasia.  Able to follow 3 step commands without difficulty. Cranial Nerves: II: Discs not visualized; Visual fields grossly normal, pupils equal, round, reactive to light and accommodation III,IV, VI: ptosis not present, extra-ocular motions intact bilaterally V,VII: smile symmetric, facial light touch sensation normal bilaterally VIII: hearing normal bilaterally IX,X: gag reflex present XI: bilateral shoulder shrug XII: midline tongue extension Motor: Right : Upper extremity   5/5    Left:     Upper extremity   5/5  Lower extremity   5/5     Lower extremity   5/5 Tone and bulk:normal tone throughout; no atrophy noted Sensory: Light touch intact throughout, bilaterally Plantars: Right: downgoing   Left: downgoing Cerebellar:  normal heel-to-shin test, mildly slower on FNF on the left.  Gait: Deferred CV: pulses palpable throughout   Laboratory Studies:   Basic Metabolic Panel:  Recent Labs Lab 08/24/14 0650 08/24/14 0808 08/25/14 1040  NA 133* 131* 133*  K 4.4 3.8 3.8  CL 96 96 98  CO2  --  23 26  GLUCOSE 174* 177*  137*  BUN CREATININE 1.10 1.06 0.72  CALCIUM  --  8.0* 7.7*  MG  --  1.8  --     Liver Function Tests:  Recent Labs Lab 08/24/14 0808  AST 37  ALT 29  ALKPHOS 53  BILITOT 1.1  PROT 6.2  ALBUMIN 3.7    Recent Labs Lab 08/24/14 0808  LIPASE 33   No results for input(s): AMMONIA in the last 168 hours.  CBC:  Recent Labs Lab 08/24/14 0630 08/24/14 0650 08/25/14 1040  WBC 7.8  --  7.0  NEUTROABS 5.6  --  5.5  HGB 14.9 15.0 13.8  HCT 42.2 44.0 38.3*  MCV 85.8  --  83.3  PLT 128*  --  98*    Cardiac Enzymes: No results for input(s): CKTOTAL, CKMB, CKMBINDEX, TROPONINI in the last 168 hours.  BNP: Invalid input(s): POCBNP  CBG:  Recent Labs Lab 08/24/14 0544  GLUCAP 166*    Microbiology: Results for orders placed or performed during the hospital encounter of 08/24/14  MRSA PCR Screening     Status: None   Collection Time: 08/24/14  9:11 AM  Result Value Ref Range Status   MRSA by PCR NEGATIVE NEGATIVE Final    Comment:        The GeneXpert MRSA Assay (FDA approved for NASAL specimens only), is one component of  a comprehensive MRSA colonization surveillance program. It is not intended to diagnose MRSA infection nor to guide or monitor treatment for MRSA infections.     Coagulation Studies:  Recent Labs  08/24/14 0630  LABPROT 14.9  INR 1.16    Urinalysis:  Recent Labs Lab 08/24/14 0601  COLORURINE YELLOW  LABSPEC 1.019  PHURINE 6.0  GLUCOSEU NEGATIVE  HGBUR NEGATIVE  BILIRUBINUR NEGATIVE  KETONESUR 15*  PROTEINUR NEGATIVE  UROBILINOGEN 1.0  NITRITE NEGATIVE  LEUKOCYTESUR NEGATIVE    Lipid Panel:  No results found for: CHOL, TRIG, HDL, CHOLHDL, VLDL, LDLCALC  HgbA1C: No results found for: HGBA1C  Urine Drug Screen:     Component Value Date/Time   LABOPIA NONE DETECTED 08/24/2014 0601   COCAINSCRNUR NONE DETECTED 08/24/2014 0601   LABBENZ NONE DETECTED 08/24/2014 0601   AMPHETMU NONE DETECTED 08/24/2014 0601    THCU NONE DETECTED 08/24/2014 0601   LABBARB NONE DETECTED 08/24/2014 0601    Alcohol Level:  Recent Labs Lab 08/24/14 0808  ETH <5    Other results: EKG: Sinus bradycardia rate 54 with PVCs. Please refer to the formal cardiology reading for complete details.  Imaging:  Dg Chest 2 View 08/24/2014    There is a monitor lead in the mid right hemithorax area which is felt to lead to the opacity in this area. A small area of infiltrate in this area conceivably could be obscured by the monitor lead. Beyond this small area, the lungs are clear.      Ct Head Wo Contrast 08/25/2014    1. Development of additional hemorrhagic contusions in the inferior left frontal lobe.  2. New, small focus of hemorrhage in the left thalamus/cerebral peduncle.  3. New/increased hemorrhagic contusion in the inferior right frontal lobe.  4. New hemorrhagic contusion in the left parietal lobe.  5. Small volume subarachnoid hemorrhage, stable to slightly increased, with new, small amount of intraventricular hemorrhage. No hydrocephalus.  6. Slightly increased edema associated with the left temporal lobe hemorrhagic contusion.  7. Unchanged epidural hematoma associated with complex parieto-occipital skull fracture.      Ct Head Wo Contrast 08/24/2014    1. Complex skull fracture with occipital fragment depression over the superior sagittal sinus by 6 mm. There is underlying venous epidural hemorrhage.  2. Hemorrhagic contusions in the inferior frontal and inferior left temporal lobes.  3. Scattered subarachnoid and subdural hemorrhage without shift or herniation.  4. Calvarial fractures continue into the left mastoid temporal bone. Suspect nondisplaced fracture of the left orbital roof.  5. Pneumocephalus.  6. Negative cervical spine.      Ct Cervical Spine Wo Contrast 08/24/2014    1. Complex skull fracture with occipital fragment depression over the superior sagittal sinus by 6 mm. There is underlying  venous epidural hemorrhage.  2. Hemorrhagic contusions in the inferior frontal and inferior left temporal lobes.  3. Scattered subarachnoid and subdural hemorrhage without shift or herniation.  4. Calvarial fractures continue into the left mastoid temporal bone. Suspect nondisplaced fracture of the left orbital roof.  5. Pneumocephalus.  6. Negative cervical spine.      Assessment/Plan:   64 year old male with a history of multiple episodes of syncope of uncertain etiology. Also noted to be mildly hyponatremic with low calcium levels and low platelet counts. Worked up last year in [place] by cardiology and neurology for syncope with no definitive diagnosis. History of bradycardia which he attributes to being a runner.   Further assessment and plan to follow per Dr.  Uvaldo Rising Rinehuls PA-C [facility] Pager [phone] 08/25/2014, 4:27 PM   I have seen and evaluated the patient. I have reviewed the above note and made appropriate changes. 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 The description of the events sounds much more concerning for cardiac/vasomotor syncope rather than seizure. I do not see that an EEG was performed previously. With actual LOC during his previous event, as well as with this event. I do not think that an inner ear problem would be explanatory.   I do not see a record of an EEG being done previously and would favor performing this, though even if there were sharp activity it would be hard to say if this was the result of his injury or the cause.   1) EEG, this will likely not be done until Monday.  2) Agree with continued telemetry, consideration of ILR 3) will check back after EEG   Ritta Slot, MD [facility] [phone]  If 7pm- 7am, please page neurology on call as listed in [facility]ION.

## 2014-08-25 NOTE — Progress Notes (Signed)
Subjective: Patient resting in bed, denies headache or other complaints. Did have some nausea overnight, none now. No vomiting. Nursing staff notes episodic bradycardia, to the 30-40 beats per minute range.  Objective: Vital signs in last 24 hours: Filed Vitals:   08/25/14 0500 08/25/14 0600 08/25/14 0700 08/25/14 0800  BP: 131/67 124/73 123/75 121/66  Pulse: 77 55 51 55  Temp:    99.3 F (37.4 C)  TempSrc:    Oral  Resp: $Remo'19 15 16 17  'iovfB$ SpO2: 98% 99% 98% 98%    Intake/Output from previous day: 03/04 0701 - 03/05 0700 In: 1425 [I.V.:1425] Out: 2100 [Urine:2100] Intake/Output this shift:    Physical Exam:  Awake alert, oriented to name, [facility], hospital, March, and 2016. Bilateral raccoon eyes. Following commands. Moving all 4 extremities well. No drift of upper extremities. EOMI. Facial movements symmetrical. Tongue midline.  CBC  Recent Labs  08/24/14 0630 08/24/14 0650  WBC 7.8  --   HGB 14.9 15.0  HCT 42.2 44.0  PLT 128*  --    BMET  Recent Labs  08/24/14 0650 08/24/14 0808  NA 133* 131*  K 4.4 3.8  CL 96 96  CO2  --  23  GLUCOSE 174* 177*  BUN 15 11  CREATININE 1.10 1.06  CALCIUM  --  8.0*    Studies/Results: Dg Chest 2 View  08/24/2014   CLINICAL DATA:  Altered mental status/syncope  EXAM: CHEST  2 VIEW  COMPARISON:  None.  FINDINGS: There is a monitor lead overlying a portion of the right midchest. There is opacity in this area presumably from the monitor lead. Apart from this area, lungs are clear. Heart size and pulmonary vascularity are normal. No adenopathy. No pneumothorax. No bone lesions.  IMPRESSION: There is a monitor lead in the mid right hemithorax area which is felt to lead to the opacity in this area. A small area of infiltrate in this area conceivably could be obscured by the monitor lead. Beyond this small area, the lungs are clear.   Electronically Signed   By: Lowella Grip III M.D.   On: 08/24/2014 07:04   Ct Head Wo  Contrast  08/24/2014   CLINICAL DATA:  Syncope and fall.  EXAM: CT HEAD WITHOUT CONTRAST  CT CERVICAL SPINE WITHOUT CONTRAST  TECHNIQUE: Multidetector CT imaging of the head and cervical spine was performed following the standard protocol without intravenous contrast. Multiplanar CT image reconstructions of the cervical spine were also generated.  COMPARISON:  None.  FINDINGS: CT HEAD FINDINGS  Skull and Sinuses:There is a complex fracture of the calvarium, with a triangular fracture in the upper occipital bone that is depressed up to 6 mm. This is over the superior sagittal sinus. Fractures continued into the bilateral parietal bones ending on the left in the frontal suture which is mildly diastatic. Inferior continuation in the into the left squamosal temporal bone, presumably reaching the mastoid temporal bone given left mastoid effusion. No ossicular chain or otic capsule disruption. Pneumocephalus present. Question nondisplaced fractures of the left orbital roof.  Orbits: No acute abnormality.  Brain: There is hematoma at the vertex neighboring the depressed fracture consistent with venous epidural hematoma. Scattered bilateral subarachnoid hemorrhage, most thick around the left frontal pole were there is also hemorrhagic contusion and probable subdural blood. There is small hemorrhagic contusion in the left temporal lobe inferiorly along tegmen mastoideum. Suspect small hemorrhagic contusion in the inferior right frontal lobe.  No hydrocephalus, shift, or herniation.  No evidence of  infarct.  CT CERVICAL SPINE FINDINGS  Negative for acute fracture or subluxation. No prevertebral edema. No gross cervical canal hematoma. No significant osseous canal or foraminal stenosis.  Critical Value/emergent results were called by telephone at the time of interpretation on 08/24/2014 at 6:33 am to Dr. Everlene Balls , who verbally acknowledged these results.  IMPRESSION: 1. Complex skull fracture with occipital fragment  depression over the superior sagittal sinus by 6 mm. There is underlying venous epidural hemorrhage. 2. Hemorrhagic contusions in the inferior frontal and inferior left temporal lobes. 3. Scattered subarachnoid and subdural hemorrhage without shift or herniation. 4. Calvarial fractures continue into the left mastoid temporal bone. Suspect nondisplaced fracture of the left orbital roof. 5. Pneumocephalus. 6. Negative cervical spine.   Electronically Signed   By: Monte Fantasia M.D.   On: 08/24/2014 06:41   Ct Cervical Spine Wo Contrast  08/24/2014   CLINICAL DATA:  Syncope and fall.  EXAM: CT HEAD WITHOUT CONTRAST  CT CERVICAL SPINE WITHOUT CONTRAST  TECHNIQUE: Multidetector CT imaging of the head and cervical spine was performed following the standard protocol without intravenous contrast. Multiplanar CT image reconstructions of the cervical spine were also generated.  COMPARISON:  None.  FINDINGS: CT HEAD FINDINGS  Skull and Sinuses:There is a complex fracture of the calvarium, with a triangular fracture in the upper occipital bone that is depressed up to 6 mm. This is over the superior sagittal sinus. Fractures continued into the bilateral parietal bones ending on the left in the frontal suture which is mildly diastatic. Inferior continuation in the into the left squamosal temporal bone, presumably reaching the mastoid temporal bone given left mastoid effusion. No ossicular chain or otic capsule disruption. Pneumocephalus present. Question nondisplaced fractures of the left orbital roof.  Orbits: No acute abnormality.  Brain: There is hematoma at the vertex neighboring the depressed fracture consistent with venous epidural hematoma. Scattered bilateral subarachnoid hemorrhage, most thick around the left frontal pole were there is also hemorrhagic contusion and probable subdural blood. There is small hemorrhagic contusion in the left temporal lobe inferiorly along tegmen mastoideum. Suspect small hemorrhagic  contusion in the inferior right frontal lobe.  No hydrocephalus, shift, or herniation.  No evidence of infarct.  CT CERVICAL SPINE FINDINGS  Negative for acute fracture or subluxation. No prevertebral edema. No gross cervical canal hematoma. No significant osseous canal or foraminal stenosis.  Critical Value/emergent results were called by telephone at the time of interpretation on 08/24/2014 at 6:33 am to Dr. Everlene Balls , who verbally acknowledged these results.  IMPRESSION: 1. Complex skull fracture with occipital fragment depression over the superior sagittal sinus by 6 mm. There is underlying venous epidural hemorrhage. 2. Hemorrhagic contusions in the inferior frontal and inferior left temporal lobes. 3. Scattered subarachnoid and subdural hemorrhage without shift or herniation. 4. Calvarial fractures continue into the left mastoid temporal bone. Suspect nondisplaced fracture of the left orbital roof. 5. Pneumocephalus. 6. Negative cervical spine.   Electronically Signed   By: Monte Fantasia M.D.   On: 08/24/2014 06:41    Assessment/Plan: Patient improved neurologically.  Met with patient's wife and daughter at the bedside. Patient's wife brought a notebook of medical information regarding difficulties the patient had last year with syncope, for which he underwent an extensive workup at the [facility], including neurology and cardiology workup. 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  With the repeated syncope and the bradycardia picked up on  the monitor I've requested cardiology consultation. I've also spoke with Dr. Kathrynn Speed and requested a neurology consultation.  We will recheck labs including bmet and CBC with differential, repeat a EKG, and repeat a CT the brain without contrast.  I had the opportunity to speak with the patient's wife and daughter at the bedside, and reviewed the patient's admission CT the brain with  them. The nature of his head injury including skull fracture and multiple areas of cerebral contusion were demonstrated and described to them their questions regarding his condition and her plans for treatment and care were explained.   I have asked the patient's wife to contact his neurologist in [place] to tentatively arrange follow-up with him for early on the week of March 14, anticipating that he will hopefully stabilize prior to that, and be able to return to [facility] for further care.  Will continue bedrest until cleared by cardiology to mobilize. We'll begin clear liquids and monitor patient.   Hosie Spangle, MD 08/25/2014, 9:27 AM

## 2014-08-26 DIAGNOSIS — R55 Syncope and collapse: Secondary | ICD-10-CM

## 2014-08-26 DIAGNOSIS — R001 Bradycardia, unspecified: Secondary | ICD-10-CM

## 2014-08-26 LAB — BASIC METABOLIC PANEL
Anion gap: 6 (ref 5–15)
BUN: 11 mg/dL (ref 6–23)
CO2: 29 mmol/L (ref 19–32)
Calcium: 7.7 mg/dL — ABNORMAL LOW (ref 8.4–10.5)
Chloride: 94 mmol/L — ABNORMAL LOW (ref 96–112)
Creatinine, Ser: 0.68 mg/dL (ref 0.50–1.35)
GFR calc Af Amer: >90 mL/min (ref >90)
GFR calc non Af Amer: >90 mL/min (ref >90)
Glucose, Bld: 161 mg/dL — ABNORMAL HIGH (ref 70–99)
Potassium: 3.7 mmol/L (ref 3.5–5.1)
Sodium: 129 mmol/L — ABNORMAL LOW (ref 135–145)

## 2014-08-26 LAB — D-DIMER, QUANTITATIVE: D-Dimer, Quant: 3.53 ug/mL-FEU — ABNORMAL HIGH (ref 0.00–0.48)

## 2014-08-26 MED ORDER — HYDROCODONE-ACETAMINOPHEN 5-325 MG PO TABS
1.0000 | ORAL_TABLET | ORAL | Status: DC | PRN
  Administered 2014-08-26 – 2014-08-27 (×3): 1 via ORAL
  Administered 2014-08-27 – 2014-08-30 (×10): 2 via ORAL
  Filled 2014-08-26: qty 1
  Filled 2014-08-26: qty 2
  Filled 2014-08-26: qty 1
  Filled 2014-08-26 (×3): qty 2
  Filled 2014-08-26: qty 1
  Filled 2014-08-26 (×4): qty 2
  Filled 2014-08-26: qty 1
  Filled 2014-08-26: qty 2
  Filled 2014-08-26: qty 1

## 2014-08-26 MED ORDER — ACETAMINOPHEN 325 MG PO TABS
325.0000 mg | ORAL_TABLET | ORAL | Status: DC | PRN
  Administered 2014-08-26: 650 mg via ORAL
  Filled 2014-08-26: qty 2

## 2014-08-26 NOTE — Progress Notes (Signed)
Subjective: Patient complaining of mild headache, given Tylenol. Seen by Dr. Berton [name] from EP cardiology and Dr. Onalee [name] from neurology. There are overall sense is that his syncope is most likely of a cardiac/arrhythmia origin. Dr. Graciela [name] has requested an echocardiogram, that's been done, the results are pending. Dr. Amada [name] requested EEG which will be done tomorrow. Dr. Graciela [name] has suggested a implantable loop recorder (ILR), and Dr. Amada [name] concurs.  Patient remains bradycardic, typically in the 40s, but frequently into the 30s.  Repeat CT of the brain without contrast yesterday showed expected blossoming of cerebral contusions, particularly in the left frontal region and in the left temporal region. Mild localized mass effect, but no shift.  Objective: Vital signs in last 24 hours: Filed Vitals:   08/26/14 0700 08/26/14 0800 08/26/14 0900 08/26/14 1000  BP: 149/74 132/64 122/71 120/54  Pulse: 45 48 42 46  Temp:  98 F (36.7 C)    TempSrc:  Oral    Resp: Height:      Weight:      SpO2: 98% 97% 99% 98%    Intake/Output from previous day: 03/05 0701 - 03/06 0700 In: 2300 [P.O.:500; I.V.:1800] Out: 975 [Urine:975] Intake/Output this shift: Total I/O In: 225 [I.V.:225] Out: -   Physical Exam:  Awake and alert, fully oriented. Speech fluent. Following commands. Pupils equal, round, reactive to light. [name]. Face symmetrical. Moving all 4 extremities well. No drift of upper extremities.  CBC  Recent Labs  08/24/14 0630 08/24/14 0650 08/25/14 1040  WBC 7.8  --  7.0  HGB 14.9 15.0 13.8  HCT 42.2 44.0 38.3*  PLT 128*  --  98*   BMET  Recent Labs  08/24/14 0808 08/25/14 1040  NA 131* 133*  K 3.8 3.8  CL 96 98  CO2 23 26  GLUCOSE 177* 137*  BUN 11 16  CREATININE 1.06 0.72  CALCIUM 8.0* 7.7*    Studies/Results: Ct Head Wo Contrast  08/25/2014   CLINICAL DATA:  Head injury with skull fracture.  Initial encounter.  EXAM: CT HEAD  WITHOUT CONTRAST  TECHNIQUE: Contiguous axial images were obtained from the base of the skull through the vertex without intravenous contrast.  COMPARISON:  08/24/2014  FINDINGS: Complex parieto-occipital skull fracture is again seen with approximately 6 mm depression of a central fragment in the midline and suspected extension to the mastoid portion of the left temporal bone, unchanged. Underlying epidural hematoma does not appear significantly changed, greatest near the vertex.  There is a new, small amount of acute subdural hemorrhage along the left posterior falx measuring 6 mm in thickness. A small amount of acute subarachnoid hemorrhage is again noted scattered about the cerebral hemispheres. There is a new 1 cm hemorrhagic contusion in the medial left parietal lobe subjacent to the skull fracture. There is also a new subcentimeter focus of hemorrhage involving the left thalamus/ cerebral peduncle.  Foci of parenchymal hemorrhage in the inferior left frontal lobe have increased in size and number, the largest measuring 2.7 x 1.3 cm with mild surrounding edema. There is overlying subarachnoid hemorrhage and likely small volume subdural hemorrhage, similar to prior. Trace right frontal subdural and/or subarachnoid hemorrhage is unchanged. There is an inferior right frontal lobe contusion with newly evident edema as well as a 9 mm focus of hemorrhage.  Hemorrhage in the left temporal lobe is similar to the prior study with slightly increased edema. There is a new, small amount of hemorrhage in  the occipital horn of the right lateral ventricle. There is no hydrocephalus. There is no midline shift. No acute large territory infarct is identified.  A nondisplaced left orbital roof fracture is again questioned. Small amount of left mastoid air cell fluid is unchanged. Right mastoid air cells are clear. Visualized paranasal sinuses are clear. Scalp swelling/ hematoma has mildly increased in size, particularly in the  left temporal region.  IMPRESSION: 1. Development of additional hemorrhagic contusions in the inferior left frontal lobe. 2. New, small focus of hemorrhage in the left thalamus/cerebral peduncle. 3. New/increased hemorrhagic contusion in the inferior right frontal lobe. 4. New hemorrhagic contusion in the left parietal lobe. 5. Small volume subarachnoid hemorrhage, stable to slightly increased, with new, small amount of intraventricular hemorrhage. No hydrocephalus. 6. Slightly increased edema associated with the left temporal lobe hemorrhagic contusion. 7. Unchanged epidural hematoma associated with complex parieto-occipital skull fracture.   Electronically Signed   By: Sebastian AcheAllen  [name]   On: 08/25/2014 11:58    Assessment/Plan: Appreciate Drs. [name]'s and [name]'s consultations. CT changes as expected. We'll recheck in a few days.  Mild hyponatremia, we'll recheck BMET now and in a.m. Mild thrombocytopenia, we'll recheck CBC with differential in a.m.  Spoke with patient, his wife, and his daughter at the bedside at length today reviewed consultations and CT results. Also discussed continued bradycardia and Dr. Odessa [name]'s recommendation for ILR.  Recommended to the patient's wife that she contact the patient's neurologist and cardiologist in [place], update them on the events over the past 3 days, and set the patient up for follow-up with each of them the week of March 14.  [name],Terry W, MD 08/26/2014, 11:24 AM

## 2014-08-26 NOTE — Progress Notes (Signed)
  Echocardiogram 2D Echocardiogram has been performed.  Janalyn HarderWest, [employer] R 08/26/2014, 9:41 AM

## 2014-08-26 NOTE — Progress Notes (Signed)
Patient Name: [name]      SUBJECTIVE:*still with HA  Fitbit technology >> plummeting HR with event, and the night before HR>>150     Past Medical History  Diagnosis Date  . Syncope   . IBS (irritable bowel syndrome)     Scheduled Meds:  Scheduled Meds:  Continuous Infusions: . sodium chloride 75 mL/hr at 08/26/14 0600   acetaminophen, HYDROcodone-acetaminophen, ondansetron    PHYSICAL EXAM Filed Vitals:   08/26/14 1000 08/26/14 1100 08/26/14 1200 08/26/14 1300  BP: 120/54 108/56 126/74 138/66  Pulse: 46 50 38 42  Temp:   98.4 F (36.9 C)   TempSrc:   Axillary   Resp: Height:      Weight:      SpO2: 98% 99% 100% 100%    Well developed and nourished in no acute distress HENT normal Neck supple with JVP-flat Clear Regular rate and rhythm, no murmurs or gallops Abd-soft with active BS No Clubbing cyanosis edema Skin-warm and dry A & Oriented  Grossly normal sensory and motor function  TELEMETRY: Reviewed telemetry pt in *sinus with JHR>>30 but mostly in the 40s    Intake/Output Summary (Last 24 hours) at 08/26/14 1332 Last data filed at 08/26/14 1100  Gross per 24 hour  Intake   2450 ml  Output    450 ml  Net   2000 ml    LABS: Basic Metabolic Panel:  Recent Labs Lab 08/24/14 0650 08/24/14 0808 08/25/14 1040  NA 133* 131* 133*  K 4.4 3.8 3.8  CL 96 96 98  CO2  --  23 26  GLUCOSE 174* 177* 137*  BUN CREATININE 1.10 1.06 0.72  CALCIUM  --  8.0* 7.7*  MG  --  1.8  --    Cardiac Enzymes: No results for input(s): CKTOTAL, CKMB, CKMBINDEX, TROPONINI in the last 72 hours. CBC:  Recent Labs Lab 08/24/14 0630 08/24/14 0650 08/25/14 1040  WBC 7.8  --  7.0  NEUTROABS 5.6  --  5.5  HGB 14.9 15.0 13.8  HCT 42.2 44.0 38.3*  MCV 85.8  --  83.3  PLT 128*  --  98*   PROTIME:  Recent Labs  08/24/14 0630  LABPROT 14.9  INR 1.16   Liver Function Tests:  Recent Labs  08/24/14 0808  AST 37    ALT 29  ALKPHOS 53  BILITOT 1.1  PROT 6.2  ALBUMIN 3.7    Recent Labs  08/24/14 0808  LIPASE 33   BNP: BNP (last 3 results) No results for input(s): BNP in the last 8760 hours.  ProBNP (last 3 results) No results for input(s): PROBNP in the last 8760 hours.  D-Dimer:  Recent Labs  08/26/14 0306  DDIMER 3.53*   Hemoglobin A1C: No results for input(s): HGBA1C in the last 72 hours. Fasting Lipid Panel: No results for input(s): CHOL, HDL, LDLCALC, TRIG, CHOLHDL, LDLDIRECT in the last 72 hours. Thyroid Function Tests: No results for input(s): TSH, T4TOTAL, T3FREE, THYROIDAB in the last 72 hours.  Invalid input(s): FREET3 Anemia Panel: No results for input(s): VITAMINB12, FOLATE, FERRITIN, TIBC, IRON, RETICCTPCT in the last 72 hours.       ASSESSMENT AND PLAN:  Active Problems:   Skull fracture   Syncope   Sinus bradycardia  Looked at FITBIT data; i am not sure of the mechanism by which fitbit measures HR --is it pulse wave or electical signal, and that would  have some bearing of the data The family is also looking to review the hotel video  I am not sure of the relationship fo the sinus bradycardia, but it raises rthe spectre that it could be mechanistically related to his events. This, as cardioinhibition with neurally mediated syncope, might identify pacing as potential treatment  For now would follow   Signed, Sherryl MangesSteven Klein MD  08/26/2014

## 2014-08-26 NOTE — Progress Notes (Signed)
2nd deg type 2 was noted on march 4(see strips).tonight most sinus brady and occ Junctional rhythm-12 ekg done w/noted p wave inversion in the inferior leads.

## 2014-08-27 ENCOUNTER — Inpatient Hospital Stay (HOSPITAL_COMMUNITY): Payer: Managed Care, Other (non HMO)

## 2014-08-27 DIAGNOSIS — I442 Atrioventricular block, complete: Secondary | ICD-10-CM | POA: Insufficient documentation

## 2014-08-27 DIAGNOSIS — R55 Syncope and collapse: Secondary | ICD-10-CM

## 2014-08-27 LAB — CBC WITH DIFFERENTIAL/PLATELET
Basophils Absolute: 0 10*3/uL (ref 0.0–0.1)
Basophils Relative: 0 % (ref 0–1)
Eosinophils Absolute: 0 10*3/uL (ref 0.0–0.7)
Eosinophils Relative: 0 % (ref 0–5)
HCT: 37.9 % — ABNORMAL LOW (ref 39.0–52.0)
Hemoglobin: 13.5 g/dL (ref 13.0–17.0)
Lymphocytes Relative: 12 % (ref 12–46)
Lymphs Abs: 0.9 10*3/uL (ref 0.7–4.0)
MCH: 29.3 pg (ref 26.0–34.0)
MCHC: 35.6 g/dL (ref 30.0–36.0)
MCV: 82.4 fL (ref 78.0–100.0)
Monocytes Absolute: 0.9 10*3/uL (ref 0.1–1.0)
Monocytes Relative: 12 % (ref 3–12)
Neutro Abs: 5.8 10*3/uL (ref 1.7–7.7)
Neutrophils Relative %: 76 % (ref 43–77)
Platelets: 87 10*3/uL — ABNORMAL LOW (ref 150–400)
RBC: 4.6 MIL/uL (ref 4.22–5.81)
RDW: 12.4 % (ref 11.5–15.5)
WBC: 7.6 10*3/uL (ref 4.0–10.5)

## 2014-08-27 LAB — BASIC METABOLIC PANEL
Anion gap: 6 (ref 5–15)
BUN: 10 mg/dL (ref 6–23)
CO2: 27 mmol/L (ref 19–32)
Calcium: 7.8 mg/dL — ABNORMAL LOW (ref 8.4–10.5)
Chloride: 96 mmol/L (ref 96–112)
Creatinine, Ser: 0.58 mg/dL (ref 0.50–1.35)
GFR calc Af Amer: >90 mL/min (ref >90)
GFR calc non Af Amer: >90 mL/min (ref >90)
Glucose, Bld: 126 mg/dL — ABNORMAL HIGH (ref 70–99)
Potassium: 3.5 mmol/L (ref 3.5–5.1)
Sodium: 129 mmol/L — ABNORMAL LOW (ref 135–145)

## 2014-08-27 MED ORDER — SODIUM CHLORIDE 3 % IV SOLN
INTRAVENOUS | Status: DC
  Administered 2014-08-27: 10 mL/h via INTRAVENOUS
  Filled 2014-08-27: qty 500

## 2014-08-27 NOTE — Progress Notes (Signed)
Patient Name: [name] Date of Encounter: 08/27/2014  Active Problems:   Skull fracture   Syncope   Sinus bradycardia   Primary Cardiologist: Dr. Graciela Husbands here, MD in [place]  Patient Profile: 64 yo male w/ hx syncope remotely (?dehydration/vasovagal) and previous eval at [facility] executive mgt program w/ + tilt table, collapsed without warning on 03/05, with skull fx and hemorrhage.  SUBJECTIVE: Heart rate at baseline 50s, he would see 40s on occasion, no history of presyncope, other than with syncope.  OBJECTIVE Filed Vitals:   08/27/14 0500 08/27/14 0600 08/27/14 0700 08/27/14 0748  BP: 128/84 133/62 126/81   Pulse: 40 46 48   Temp:    98 F (36.7 C)  TempSrc:    Oral  Resp: Height:      Weight:      SpO2: 99% 98% 96%     Intake/Output Summary (Last 24 hours) at 08/27/14 0757 Last data filed at 08/27/14 0700  Gross per 24 hour  Intake   2580 ml  Output      0 ml  Net   2580 ml   Filed Weights   08/26/14 0000  Weight: 154 lb 5.2 oz (70 kg)    PHYSICAL EXAM General: Well developed, well nourished, male in no acute distress. Head: Normocephalic, racoon eyes  Neck: Supple without bruits, JVD not elevated Lungs:  Resp regular and unlabored, CTA. Heart: RRR, S1, S2, no S3, S4, or murmur; no rub. Abdomen: Soft, non-tender, non-distended, BS + x 4.  Extremities: No clubbing, cyanosis, no edema.  Neuro: Alert and oriented X 3. Moves all extremities spontaneously. Psych: Normal affect.  LABS: CBC: Recent Labs  08/25/14 1040 08/27/14 0414  WBC 7.0 7.6  NEUTROABS 5.5 5.8  HGB 13.8 13.5  HCT 38.3* 37.9*  MCV 83.3 82.4  PLT 98* 87*   Basic Metabolic Panel: Recent Labs  08/24/14 0808  08/26/14 1340 08/27/14 0414  NA 131*  < > 129* 129*  K 3.8  < > 3.7 3.5  CL 96  < > 94* 96  CO2 23  < > 29 27  GLUCOSE 177*  < > 161* 126*  BUN 11  < > 11 10  CREATININE 1.06  < > 0.68 0.58  CALCIUM 8.0*  < > 7.7* 7.8*  MG 1.8  --   --   --   < > =  values in this interval not displayed. Liver Function Tests: Recent Labs  08/24/14 0808  AST 37  ALT 29  ALKPHOS 53  BILITOT 1.1  PROT 6.2  ALBUMIN 3.7   D-dimer: Recent Labs  08/26/14 0306  DDIMER 3.53*   TELE:  SR, sinus brady with junctional beats (?escape beats), junctional rhythm at times. Brief dips into the high 20s, will sustain in the 30s and 40s     ECHO: 08/26/2014  Study Conclusions - Left ventricle: The cavity size was normal. Wall thickness was normal. The estimated ejection fraction was 55%. Wall motion was normal; there were no regional wall motion abnormalities. Left ventricular diastolic function parameters were normal. - Aortic valve: There was no stenosis. - Mitral valve: There was no significant regurgitation. - Left atrium: The atrium was mildly dilated. - Right ventricle: The cavity size was normal. Systolic function was normal. - Tricuspid valve: Peak RV-RA gradient (S): 20 mm Hg. - Pulmonary arteries: PA peak pressure: 28 mm Hg (S). - Systemic veins: IVC measured 1.8 cm with <  50% respirophasic variation, suggesting RA pressure 8 mmHg. Impressions: - Normal LV size with EF 55%. Normal diastolic function. Normal RV size and systolic function. No significant valvular abnormalities.  Radiology/Studies: Ct Head Wo Contrast 08/25/2014   CLINICAL DATA:  Head injury with skull fracture.  Initial encounter.  EXAM: CT HEAD WITHOUT CONTRAST  TECHNIQUE: Contiguous axial images were obtained from the base of the skull through the vertex without intravenous contrast.  COMPARISON:  08/24/2014  FINDINGS: Complex parieto-occipital skull fracture is again seen with approximately 6 mm depression of a central fragment in the midline and suspected extension to the mastoid portion of the left temporal bone, unchanged. Underlying epidural hematoma does not appear significantly changed, greatest near the vertex.  There is a new, small amount of acute subdural  hemorrhage along the left posterior falx measuring 6 mm in thickness. A small amount of acute subarachnoid hemorrhage is again noted scattered about the cerebral hemispheres. There is a new 1 cm hemorrhagic contusion in the medial left parietal lobe subjacent to the skull fracture. There is also a new subcentimeter focus of hemorrhage involving the left thalamus/ cerebral peduncle.  Foci of parenchymal hemorrhage in the inferior left frontal lobe have increased in size and number, the largest measuring 2.7 x 1.3 cm with mild surrounding edema. There is overlying subarachnoid hemorrhage and likely small volume subdural hemorrhage, similar to prior. Trace right frontal subdural and/or subarachnoid hemorrhage is unchanged. There is an inferior right frontal lobe contusion with newly evident edema as well as a 9 mm focus of hemorrhage.  Hemorrhage in the left temporal lobe is similar to the prior study with slightly increased edema. There is a new, small amount of hemorrhage in the occipital horn of the right lateral ventricle. There is no hydrocephalus. There is no midline shift. No acute large territory infarct is identified.  A nondisplaced left orbital roof fracture is again questioned. Small amount of left mastoid air cell fluid is unchanged. Right mastoid air cells are clear. Visualized paranasal sinuses are clear. Scalp swelling/ hematoma has mildly increased in size, particularly in the left temporal region.  IMPRESSION: 1. Development of additional hemorrhagic contusions in the inferior left frontal lobe. 2. New, small focus of hemorrhage in the left thalamus/cerebral peduncle. 3. New/increased hemorrhagic contusion in the inferior right frontal lobe. 4. New hemorrhagic contusion in the left parietal lobe. 5. Small volume subarachnoid hemorrhage, stable to slightly increased, with new, small amount of intraventricular hemorrhage. No hydrocephalus. 6. Slightly increased edema associated with the left temporal  lobe hemorrhagic contusion. 7. Unchanged epidural hematoma associated with complex parieto-occipital skull fracture.   Electronically Signed   By: Sebastian AcheAllen  Grady   On: 08/25/2014 11:58     Current Medications:    . sodium chloride 75 mL/hr at 08/27/14 0700    ASSESSMENT AND PLAN: Active Problems:   Skull fracture - Per NS    Syncope - cardioinhibition with neurally mediated syncope, might identify pacing as potential treatment - no dropped QRS since 03/04, multiple episodes junctional brady and junctional escape beats    Sinus bradycardia - at baseline - echo results above - asymptomatic with HR 40s  Re: Fitbit HR technology From website "[employer]" 07/2013 http://www.dcrainmaker.com/2015/02/fitbit-charge-review.html Optical sensors measure your heart rate by shining a small light through your skin to your capillaries, where it then measures blood flow through a secondary sensor in the unit that "sees" your pulse.  This technology isn't terribly new, as it's been used for years in hospitals  and other medical facilities, primarily on pulse oximeters on your fingertip. What is new however is applying it to the wrist and doing so either for 24-7 purposes, or workout tracking purposes.  In the case of 24-7 monitoring, it was really the "http://www.dcrainmaker.com/2013/07/basis-b1-review.html" watch that inaugurated that category (which they recently followed up with the "http://www.dcrainmaker.com/2014/12/basis-depth-review.html").  These sensors generally use a green light. In the case of the Fitbit sensors, all use just a green light:  Signed, Theodore Demark , PA-C 7:57 AM 08/27/2014 Strips are evident with complete heart block,  These however occurred while sleeping and according to his wife, he has symtoms consistent with sleep apnea, hence there is no therapy indicated  Recs are still 1) to undergo loop recorder insertion 2) copious fluid repletion, with limitations on salt  because of hypertension 3) needs outpt sleep study which mght mitigate the Hypertension and allow for use of salt  Reviewed with wife and daughter and patient  30 min

## 2014-08-27 NOTE — Progress Notes (Signed)
NEURO HOSPITALIST PROGRESS NOTE   SUBJECTIVE:                                                                                                                        Complains of " head and ears stuffiness" but otherwise has no neurological complains. EEG normal. Persistent bradycardiin the 40s.  OBJECTIVE:                                                                                                                           Vital signs in last 24 hours: Temp:  [97.4 F (36.3 C)-98.2 F (36.8 C)] 98 F (36.7 C) (03/07 1232) Pulse Rate:  [36-56] 53 (03/07 1200) Resp:  [11-19] 13 (03/07 1200) BP: (109-149)/(53-84) 109/53 mmHg (03/07 1200) SpO2:  [96 %-100 %] 99 % (03/07 1200)  Intake/Output from previous day: 03/06 0701 - 03/07 0700 In: 2580 [P.O.:855; I.V.:1725] Out: -  Intake/Output this shift: Total I/O In: 337.5 [I.V.:337.5] Out: -  Nutritional status: Diet regular  Past Medical History  Diagnosis Date  . Syncope   . IBS (irritable bowel syndrome)     Physical exam: pleasant male in no apparent distress. Head: normocephalic. Neck: supple, no bruits, no JVD. Cardiac: no murmurs. Lungs: clear. Abdomen: soft, no tender, no mass. Extremities: no edema. Skin: no rash  Neurologic Exam:  General: Mental Status: Alert, oriented, thought content appropriate.  Speech fluent without evidence of aphasia.  Able to follow 3 step commands without difficulty. Cranial Nerves: II: Discs flat bilaterally; Visual fields grossly normal, pupils equal, round, reactive to light and accommodation III,IV, VI: ptosis not present, extra-ocular motions intact bilaterally V,VII: smile symmetric, facial light touch sensation normal bilaterally VIII: hearing normal bilaterally IX,X: uvula rises symmetrically XI: bilateral shoulder shrug XII: midline tongue extension without atrophy or fasciculations  Motor: Right : Upper extremity   5/5    Left:      Upper extremity   5/5  Lower extremity   5/5     Lower extremity   5/5 Tone and bulk:normal tone throughout; no atrophy noted Sensory: Pinprick and light touch intact throughout, bilaterally Deep Tendon Reflexes:  Right: Upper Extremity   Left: Upper extremity   biceps (C-5 to C-6) 2/4  biceps (C-5 to C-6) 2/4 tricep (C7) 2/4    triceps (C7) 2/4 Brachioradialis (C6) 2/4  Brachioradialis (C6) 2/4  Lower Extremity Lower Extremity  quadriceps (L-2 to L-4) 2/4   quadriceps (L-2 to L-4) 2/4 Achilles (S1) 2/4   Achilles (S1) 2/4  Plantars: Right: downgoing   Left: downgoing Cerebellar: normal finger-to-nose,  normal heel-to-shin test Gait:  No tested due to multiple leads and safety reasons.    Lab Results: No results found for: CHOL Lipid Panel No results for input(s): CHOL, TRIG, HDL, CHOLHDL, VLDL, LDLCALC in the last 72 hours.  Studies/Results: No results found.  MEDICATIONS                                                                                                                       ASSESSMENT/PLAN:                                                                                                           75 with syncope, most likely neurally mediated, resulting in closed head injury with fracture of the skull, a small epidural hematoma and probably subdural hematoma in the parietal area. EEG normal. No further neurology recommendations at this time. Will sign off.  Wyatt Portela, MD [facility] [phone]  08/27/2014, 2:04 PM

## 2014-08-27 NOTE — Progress Notes (Signed)
EEG Completed; Results Pending  

## 2014-08-27 NOTE — Progress Notes (Signed)
UR completed.  Await therapy evals to know pt's exact needs for next level of care.   Carlyle LipaMichelle Latonja Bobeck, RN BSN MHA CCM Trauma/Neuro ICU Case Manager [phone]

## 2014-08-27 NOTE — Evaluation (Signed)
Physical Therapy Evaluation Patient Details Name: [name] MRN: [id number] DOB: 04/12/1951 Today's Date: 08/27/2014   History of Present Illness  Pt had syncopal episode as he was checking out from hotel and fell onto marble and sustained a occipatal fracture, SDH and epidural hematoma.   Clinical Impression  Pt admitted with above. Pt presenting with balance impairment but otherwise functioning close to baseline. Suspect pt with recovery quickly from mobility stand point. Acute PT to follow to progress ambulation and address balance.    Follow Up Recommendations No PT follow up;Supervision/Assistance - 24 hour    Equipment Recommendations  None recommended by PT    Recommendations for Other Services       Precautions / Restrictions Precautions Precautions: Fall Restrictions Weight Bearing Restrictions: No      Mobility  Bed Mobility Overal bed mobility: Needs Assistance Bed Mobility: Supine to Sit;Rolling Rolling: Supervision   Supine to sit: Min guard     General bed mobility comments: increased time, use of bed rail  Transfers Overall transfer level: Needs assistance Equipment used: 1 person hand held assist Transfers: Sit to/from Stand Sit to Stand: Min guard         General transfer comment: increased time but safe  Ambulation/Gait Ambulation/Gait assistance: Min assist Ambulation Distance (Feet): 300 Feet Assistive device: None Gait Pattern/deviations: Step-through pattern;Decreased stride length;Narrow base of support;Staggering left;Staggering right     General Gait Details: pt with impaired balance as pt achieves normal stride length or increased speed requiring minA to maintain balance.   Stairs            Wheelchair Mobility    Modified Rankin (Stroke Patients Only)       Balance Overall balance assessment: Needs assistance   Sitting balance-Leahy Scale: Fair     Standing balance support: Bilateral upper extremity  supported Standing balance-Leahy Scale: Fair      Pt unable to tandem walker without min/modA to maintain balance. Pt able to amb backwards at decreased pace and minA x 20 feet.             Standardized Balance Assessment Standardized Balance Assessment : Dynamic Gait Index   Dynamic Gait Index Level Surface: Mild Impairment Change in Gait Speed: Mild Impairment Gait with Horizontal Head Turns: Mild Impairment Gait with Vertical Head Turns: Mild Impairment Gait and Pivot Turn: Mild Impairment Step Over Obstacle: Mild Impairment Step Around Obstacles: Mild Impairment Steps: Mild Impairment Total Score: 16       Pertinent Vitals/Pain Pain Assessment: 0-10 Pain Score: 5  Pain Location: "stuffiness in head" Pain Intervention(s): Premedicated before session    Home Living Family/patient expects to be discharged to:: Private residence Living Arrangements: Spouse/significant other;Children Available Help at Discharge: Family;Available PRN/intermittently Type of Home: House Home Access: Elevator     Home Layout: One level Home Equipment: None      Prior Function Level of Independence: Independent         Comments: works full time and was training for a marathon     Higher education careers adviser   Dominant Hand: Right    Extremity/Trunk Assessment   Upper Extremity Assessment: Overall WFL for tasks assessed           Lower Extremity Assessment: Overall WFL for tasks assessed         Communication   Communication: No difficulties  Cognition Arousal/Alertness: Awake/alert Behavior During Therapy: WFL for tasks assessed/performed Overall Cognitive Status: Impaired/Different from baseline Area of Impairment: Safety/judgement;Awareness     Memory:  Decreased short-term memory   Safety/Judgement: Decreased awareness of safety Awareness: Emergent   General Comments: pt unable to recall multiple times that he is unable to fly. Pt unaware of balance deficits.     General Comments      Exercises        Assessment/Plan    PT Assessment Patient needs continued PT services  PT Diagnosis Difficulty walking   PT Problem List Decreased strength;Decreased balance;Decreased mobility  PT Treatment Interventions DME instruction;Gait training;Stair training;Functional mobility training;Therapeutic activities;Therapeutic exercise;Balance training;Neuromuscular re-education   PT Goals (Current goals can be found in the Care Plan section) Acute Rehab PT Goals Patient Stated Goal: home asap PT Goal Formulation: With patient Time For Goal Achievement: 09/03/14 Potential to Achieve Goals: Good Additional Goals Additional Goal #1: Pt to score > 21 on DGI to indicate minimal falls risk.    Frequency Min 3X/week   Barriers to discharge        Co-evaluation               End of Session Equipment Utilized During Treatment: Gait belt Activity Tolerance: Patient tolerated treatment well Patient left: in chair;with call bell/phone within reach;with bed alarm set;with family/visitor present Nurse Communication: Mobility status         Time: [id number] PT Time Calculation (min) (ACUTE ONLY): 29 min   Charges:     PT Treatments $Gait Training: 8-22 mins   PT G Codes:        Marcene BrawnChadwell, Merlyn Bollen Marie 08/27/2014, 1:42 PM  Lewis ShockAshly Binnie Droessler, PT, DPT Pager #: [phone] Office #: [phone]

## 2014-08-27 NOTE — Progress Notes (Signed)
Dr. Newell CoralNudelman has ordered 3% NS to be given via peripheral IV at very slow rate of 4010ml/hr backed up with 40 ml/hr NS.  He did not see the need for a central line at this point.  Will encourage oral intake of sodium via food and beverages and discouraged water intake for the short term. Will closely monitor neuro status as well as PIV site. Frenchie Pribyl C

## 2014-08-27 NOTE — Progress Notes (Signed)
Subjective: Patient complaining of headache as well as stuffiness in the ears. Tolerating regular diet well. Tolerated sitting up in chair. Has not yet been seen by physical therapy, who will work with the patient on transfers and ambulation. Persistent hyponatremia. Continuing to have persistent bradycardia, with heart rates in the 40s, with drops into the 30s and pauses. Continues to be followed by Dr. Graciela Husbands from cardiology. Echocardiogram apparently normal and unremarkable. For EEG today, and subsequent follow-up with Dr. Petra Kuba.  Objective: Vital signs in last 24 hours: Filed Vitals:   08/27/14 0748 08/27/14 0800 08/27/14 0900 08/27/14 1000  BP:  126/64 130/68 141/75  Pulse:  39 50 54  Temp: 98 F (36.7 C)     TempSrc: Oral     Resp:  Height:      Weight:      SpO2:  99% 99% 99%    Intake/Output from previous day: 03/06 0701 - 03/07 0700 In: 2580 [P.O.:855; I.V.:1725] Out: -  Intake/Output this shift: Total I/O In: 75 [I.V.:75] Out: -   Physical Exam:  Awake alert, fully oriented. Speech fluent. Following commands. Pupils equal, round, reactive to light. EOMI. Face symmetrical. Moving all 4 show an is well. No drift of upper extremities.  CBC  Recent Labs  08/25/14 1040 08/27/14 0414  WBC 7.0 7.6  HGB 13.8 13.5  HCT 38.3* 37.9*  PLT 98* 87*   BMET  Recent Labs  08/26/14 1340 08/27/14 0414  NA 129* 129*  K 3.7 3.5  CL 94* 96  CO2 29 27  GLUCOSE 161* 126*  BUN 11 10  CREATININE 0.68 0.58  CALCIUM 7.7* 7.8*    Studies/Results: Ct Head Wo Contrast  08/25/2014   CLINICAL DATA:  Head injury with skull fracture.  Initial encounter.  EXAM: CT HEAD WITHOUT CONTRAST  TECHNIQUE: Contiguous axial images were obtained from the base of the skull through the vertex without intravenous contrast.  COMPARISON:  08/24/2014  FINDINGS: Complex parieto-occipital skull fracture is again seen with approximately 6 mm depression of a central fragment in the midline  and suspected extension to the mastoid portion of the left temporal bone, unchanged. Underlying epidural hematoma does not appear significantly changed, greatest near the vertex.  There is a new, small amount of acute subdural hemorrhage along the left posterior falx measuring 6 mm in thickness. A small amount of acute subarachnoid hemorrhage is again noted scattered about the cerebral hemispheres. There is a new 1 cm hemorrhagic contusion in the medial left parietal lobe subjacent to the skull fracture. There is also a new subcentimeter focus of hemorrhage involving the left thalamus/ cerebral peduncle.  Foci of parenchymal hemorrhage in the inferior left frontal lobe have increased in size and number, the largest measuring 2.7 x 1.3 cm with mild surrounding edema. There is overlying subarachnoid hemorrhage and likely small volume subdural hemorrhage, similar to prior. Trace right frontal subdural and/or subarachnoid hemorrhage is unchanged. There is an inferior right frontal lobe contusion with newly evident edema as well as a 9 mm focus of hemorrhage.  Hemorrhage in the left temporal lobe is similar to the prior study with slightly increased edema. There is a new, small amount of hemorrhage in the occipital horn of the right lateral ventricle. There is no hydrocephalus. There is no midline shift. No acute large territory infarct is identified.  A nondisplaced left orbital roof fracture is again questioned. Small amount of left mastoid air cell fluid is unchanged. Right mastoid air cells  are clear. Visualized paranasal sinuses are clear. Scalp swelling/ hematoma has mildly increased in size, particularly in the left temporal region.  IMPRESSION: 1. Development of additional hemorrhagic contusions in the inferior left frontal lobe. 2. New, small focus of hemorrhage in the left thalamus/cerebral peduncle. 3. New/increased hemorrhagic contusion in the inferior right frontal lobe. 4. New hemorrhagic contusion in the  left parietal lobe. 5. Small volume subarachnoid hemorrhage, stable to slightly increased, with new, small amount of intraventricular hemorrhage. No hydrocephalus. 6. Slightly increased edema associated with the left temporal lobe hemorrhagic contusion. 7. Unchanged epidural hematoma associated with complex parieto-occipital skull fracture.   Electronically Signed   By: Sebastian AcheAllen  Grady   On: 08/25/2014 11:58    Assessment/Plan: Stable from perspective of head injury sustained with syncopal episode. Continues to undergo cardiology and neurology workup and follow-up. To begin to work on ambulation with PT. We'll add 3% saline at 10 mL per hour, and encourage by mouth intake of sodium, for treatment of hyponatremia. We'll recheck BMET in a.m. Continue neuro checks and cardiac monitoring.   Hewitt ShortsNUDELMAN,[name], MD 08/27/2014, 10:08 AM

## 2014-08-27 NOTE — Procedures (Signed)
ELECTROENCEPHALOGRAM REPORT   Patient: [name]       Room #: 1O103W13 EEG No. ID: [id number] Age: 64 y.o.        Sex: male Referring Physician: Jeral FruitBotero Report Date:  08/27/2014        Interpreting Physician: Thana FarrEYNOLDS, Asuncion Shibata  History: [name] is an 64 y.o. male with recurrent episodes of syncope  Medications:  Scheduled:   Conditions of Recording:  This is a 16 channel EEG carried out with the patient in the awake, drowsy and asleep states.  Description:  The waking background activity consists of a low voltage, symmetrical, fairly well organized, 9 Hz alpha activity, seen from the parieto-occipital and posterior temporal regions.  Low voltage fast activity, poorly organized, is seen anteriorly and is at times superimposed on more posterior regions.  A mixture of theta and alpha rhythms are seen from the central and temporal regions. The patient drowses with slowing to irregular, low voltage theta and beta activity.   The patient goes in to a light sleep with symmetrical sleep spindles, vertex central sharp transients and irregular slow activity.   No epileptiform activity is noted.   Hyperventilation and intermittent photic stimulation were not performed.   IMPRESSION: This is a normal awake and asleep electroencephalogram.  No epileptiform activity is noted.     Thana FarrLeslie Jaken Fregia, MD [facility] [phone] 08/27/2014, 12:18 PM

## 2014-08-28 LAB — BASIC METABOLIC PANEL
Anion gap: 6 (ref 5–15)
BUN: 7 mg/dL (ref 6–23)
CO2: 28 mmol/L (ref 19–32)
Calcium: 7.9 mg/dL — ABNORMAL LOW (ref 8.4–10.5)
Chloride: 95 mmol/L — ABNORMAL LOW (ref 96–112)
Creatinine, Ser: 0.59 mg/dL (ref 0.50–1.35)
GFR calc Af Amer: >90 mL/min (ref >90)
GFR calc non Af Amer: >90 mL/min (ref >90)
Glucose, Bld: 118 mg/dL — ABNORMAL HIGH (ref 70–99)
Potassium: 3.4 mmol/L — ABNORMAL LOW (ref 3.5–5.1)
Sodium: 129 mmol/L — ABNORMAL LOW (ref 135–145)

## 2014-08-28 MED ORDER — CLONIDINE HCL 0.1 MG PO TABS
0.1000 mg | ORAL_TABLET | Freq: Once | ORAL | Status: AC
  Administered 2014-08-28: 0.1 mg via ORAL
  Filled 2014-08-28: qty 1

## 2014-08-28 MED ORDER — BISACODYL 10 MG RE SUPP: 10.0000 mg | Freq: Every day | RECTAL | Status: DC | PRN

## 2014-08-28 MED ORDER — MAGNESIUM HYDROXIDE 400 MG/5ML PO SUSP
30.0000 mL | Freq: Every day | ORAL | Status: DC | PRN
  Filled 2014-08-28: qty 30

## 2014-08-28 MED ORDER — SODIUM CHLORIDE 1 G PO TABS
1.0000 g | ORAL_TABLET | Freq: Three times a day (TID) | ORAL | Status: DC
  Administered 2014-08-28 – 2014-08-30 (×7): 1 g via ORAL
  Filled 2014-08-28 (×11): qty 1

## 2014-08-28 NOTE — Progress Notes (Signed)
SUBJECTIVE: The patient is doing well today.  At this time, he denies chest pain, shortness of breath, or any new concerns.  No further syncope, still with mild headache.   CURRENT MEDICATIONS: . sodium chloride  1 g Oral TID WC   . sodium chloride 40 mL/hr at 08/28/14 0900  . sodium chloride (hypertonic) 10 mL/hr at 08/28/14 0900    OBJECTIVE: Physical Exam: Filed Vitals:   08/28/14 0600 08/28/14 0759 08/28/14 0800 08/28/14 0900  BP: 133/76  150/68 134/76  Pulse: 45  47 47  Temp:  98.4 F (36.9 C)    TempSrc:  Oral    Resp: Height:      Weight:      SpO2: 98%  98% 98%    Intake/Output Summary (Last 24 hours) at 08/28/14 1610 Last data filed at 08/28/14 0900  Gross per 24 hour  Intake 1187.5 ml  Output      0 ml  Net 1187.5 ml    Telemetry reveals sinus brady/sinus rhythm, occasional 2 second pauses (PP prolongation is suggestive of vagal trigger)  GEN- The patient is well appearing, alert and oriented x 3 today.   Head- normocephalic, periorbital ecchymosis Eyes-  + conjunctival hemorrhage Ears- hearing intact Oropharynx- clear Neck- supple,  Lungs- Clear to ausculation bilaterally, normal work of breathing Heart- Regular rate and rhythm, no murmurs, rubs or gallops, PMI not laterally displaced GI- soft, NT, ND, + BS Extremities- no clubbing, cyanosis, or edema Skin- no rash or lesion Psych- euthymic mood, full affect Neuro- strength and sensation are intact  LABS: Basic Metabolic Panel:  Recent Labs  96/04/54 0414 08/28/14 0233  NA 129* 129*  K 3.5 3.4*  CL 96 95*  CO2 27 28  GLUCOSE 126* 118*  BUN 10 7  CREATININE 0.58 0.59  CALCIUM 7.8* 7.9*   CBC:  Recent Labs  08/25/14 1040 08/27/14 0414  WBC 7.0 7.6  NEUTROABS 5.5 5.8  HGB 13.8 13.5  HCT 38.3* 37.9*  MCV 83.3 82.4  PLT 98* 87*   D-Dimer:  Recent Labs  08/26/14 0306  DDIMER 3.53*    RADIOLOGY: Dg Chest 2 View 08/24/2014   CLINICAL DATA:  Altered mental  status/syncope  EXAM: CHEST  2 VIEW  COMPARISON:  None.  FINDINGS: There is a monitor lead overlying a portion of the right midchest. There is opacity in this area presumably from the monitor lead. Apart from this area, lungs are clear. Heart size and pulmonary vascularity are normal. No adenopathy. No pneumothorax. No bone lesions.  IMPRESSION: There is a monitor lead in the mid right hemithorax area which is felt to lead to the opacity in this area. A small area of infiltrate in this area conceivably could be obscured by the monitor lead. Beyond this small area, the lungs are clear.   Electronically Signed   By: Bretta Bang III M.D.   On: 08/24/2014 07:04   Ct Head Wo Contrast 08/25/2014   CLINICAL DATA:  Head injury with skull fracture.  Initial encounter.  EXAM: CT HEAD WITHOUT CONTRAST  TECHNIQUE: Contiguous axial images were obtained from the base of the skull through the vertex without intravenous contrast.  COMPARISON:  08/24/2014  FINDINGS: Complex parieto-occipital skull fracture is again seen with approximately 6 mm depression of a central fragment in the midline and suspected extension to the mastoid portion of the left temporal bone, unchanged. Underlying epidural hematoma does not appear significantly changed, greatest near the  vertex.  There is a new, small amount of acute subdural hemorrhage along the left posterior falx measuring 6 mm in thickness. A small amount of acute subarachnoid hemorrhage is again noted scattered about the cerebral hemispheres. There is a new 1 cm hemorrhagic contusion in the medial left parietal lobe subjacent to the skull fracture. There is also a new subcentimeter focus of hemorrhage involving the left thalamus/ cerebral peduncle.  Foci of parenchymal hemorrhage in the inferior left frontal lobe have increased in size and number, the largest measuring 2.7 x 1.3 cm with mild surrounding edema. There is overlying subarachnoid hemorrhage and likely small volume subdural  hemorrhage, similar to prior. Trace right frontal subdural and/or subarachnoid hemorrhage is unchanged. There is an inferior right frontal lobe contusion with newly evident edema as well as a 9 mm focus of hemorrhage.  Hemorrhage in the left temporal lobe is similar to the prior study with slightly increased edema. There is a new, small amount of hemorrhage in the occipital horn of the right lateral ventricle. There is no hydrocephalus. There is no midline shift. No acute large territory infarct is identified.  A nondisplaced left orbital roof fracture is again questioned. Small amount of left mastoid air cell fluid is unchanged. Right mastoid air cells are clear. Visualized paranasal sinuses are clear. Scalp swelling/ hematoma has mildly increased in size, particularly in the left temporal region.  IMPRESSION: 1. Development of additional hemorrhagic contusions in the inferior left frontal lobe. 2. New, small focus of hemorrhage in the left thalamus/cerebral peduncle. 3. New/increased hemorrhagic contusion in the inferior right frontal lobe. 4. New hemorrhagic contusion in the left parietal lobe. 5. Small volume subarachnoid hemorrhage, stable to slightly increased, with new, small amount of intraventricular hemorrhage. No hydrocephalus. 6. Slightly increased edema associated with the left temporal lobe hemorrhagic contusion. 7. Unchanged epidural hematoma associated with complex parieto-occipital skull fracture.   Electronically Signed   By: Sebastian AcheAllen  Grady   On: 08/25/2014 11:58   ASSESSMENT AND PLAN:  Active Problems:   Skull fracture   Syncope   Sinus bradycardia   Complete heart block   1. Syncope Pt with recurrent syncope for which [place] has been [place].  He has had a positive tilt study in the past.  He presents with neurally mediated syncope.   No significant brady or tachy arrhythmias on telemetry EP will sign off - ok from our standpoint for patient to be transferred to  rehab.   2. Nocturnal bradycardia with P-P prolongation Will need sleep study as an outpatient to evaluate for sleep apnea   Electrophysiology team to see as needed while here. Please call with questions.  I have seen, examined the patient, and reviewed the above assessment and plan.  On exam, RRR, remains confused.  Changes to above are made where necessary.   Pt has a cardiologist in [place] with whom he would like to have his workup completed.  There is no urgent indication for any additional EP testing at this time.  He and his wife are clear that they do not wish to have an implantable loop recorder placed today.    Ok to discharge from EP standpoint when other medical issues are addressed.  I anticipate discharge to rehab possibly today. No driving until cleared by his primary cardiologist. He is instructed to contact his primary cardiologist to arrange [place] [facility] [place]  Electrophysiology team to see as needed while here. Please call with questions.  Co Sign: Hillis Range, MD 08/28/2014 10:53 AM

## 2014-08-28 NOTE — Progress Notes (Signed)
Copy of results of Tilt Table Test placed in chart.

## 2014-08-28 NOTE — Progress Notes (Signed)
If decision is made that patient needs inpatient rehab, [facility]    Holly Bodilyulbertson, Kani Jobson Leigh

## 2014-08-28 NOTE — Evaluation (Signed)
Speech Language Pathology Evaluation Patient Details Name: [name] MRN: [id number] DOB: [birth year] Today's Date: 08/28/2014 Time: 1000-1030 SLP Time Calculation (min) (ACUTE ONLY): 30 min  Problem List:  Patient Active Problem List   Diagnosis Date Noted  . Complete heart block   . Syncope 08/26/2014  . Sinus bradycardia 08/26/2014  . Skull fracture 08/24/2014   Past Medical History:  Past Medical History  Diagnosis Date  . Syncope   . IBS (irritable bowel syndrome)    Past Surgical History:  Past Surgical History  Procedure Laterality Date  . No past surgeries     HPI:  Patient had syncopal episode as he was checking out from hotel and fell onto marble and sustained a occipatal fracture, SDH and epidural hematoma.   Assessment / Plan / Recommendation Clinical Impression  Orders received; cognitive-linguistic evaluation completed.  Patient oriented to person, place, situation and time with the exception of date and day of the week; however with use of external aids patient fully oriented.  Patient demonstrates characteristics consistent with a [place] Level VII with decreased working memory abilities and some emergent awareness of his current deficits.  Given his overestimation of physical abilities and unrealistic plans of only taking 1 week off of work, he continues to require 24/7 Supervision and would benefit from skilled outpatient SLP therapy for further high-level assessment prior to returning to work.  SLP recommendations were shared with patient and spouse.        SLP Assessment  All further Speech Lanaguage Pathology  needs can be addressed in the next venue of care    Follow Up Recommendations  24 hour supervision/assistance;Outpatient SLP            Pertinent Vitals/Pain Pain Assessment: 0-10 Pain Score: 5  Pain Location: head  Pain Descriptors / Indicators: Headache Pain Intervention(s): Monitored during session;Limited activity within patient's  tolerance;Other (comment) (RN aware)   SLP Goals  Progression toward goals:  (evaluated today ) Patient/Family Stated Goal: none stated  SLP Evaluation Prior Functioning  Cognitive/Linguistic Baseline: Within functional limits Type of Home: House  Lives With: Spouse Available Help at Discharge: Family;Available PRN/intermittently Vocation: Full time employment   Cognition  Overall Cognitive Status: Impaired/Different from baseline Arousal/Alertness: Awake/alert Orientation Level: Oriented to situation;Oriented to person;Oriented to place;Disoriented to time Attention: Selective Selective Attention: Appears intact Memory: Impaired Memory Impairment: Storage deficit;Decreased short term memory Decreased Short Term Memory: Verbal basic;Functional basic Awareness: Impaired Awareness Impairment: Anticipatory impairment Problem Solving: Impaired Problem Solving Impairment: Functional complex Safety/Judgment: Impaired [place] [id number] Scales of Cognitive Functioning: Automatic/appropriate    Comprehension  Auditory Comprehension Overall Auditory Comprehension: Appears within functional limits for tasks assessed    Expression Expression Primary Mode of Expression: Verbal Verbal Expression Overall Verbal Expression: Appears within functional limits for tasks assessed   Oral / Motor Oral Motor/Sensory Function Overall Oral Motor/Sensory Function: Appears within functional limits for tasks assessed Motor Speech Overall Motor Speech: Appears within functional limits for tasks assessed   GO     Terry FerrettiMelissa Matteus [name], M.A., CCC-SLP [phone]  [name] 08/28/2014, 11:26 AM

## 2014-08-28 NOTE — Progress Notes (Signed)
Subjective: Patient resting comfortably in bed, describes mild headache. According to nurses ambulate fairly well with PT with only mild dizziness. EEG normal, neurologist signed off. Continues to be followed by Dr. Berton MountSteve Klein from electrophysiologic cardiology. Mild hyponatremia persists (sodium 129 this morning).  Objective: Vital signs in last 24 hours: Filed Vitals:   08/28/14 0302 08/28/14 0400 08/28/14 0500 08/28/14 0600  BP:  141/74 140/57 133/76  Pulse: 96 56 46 45  Temp:  98.3 F (36.8 C)    TempSrc:  Oral    Resp: 14 13 14 12   Height:      Weight:      SpO2: 100% 98% 99% 98%    Intake/Output from previous day: 03/07 0701 - 03/08 0700 In: 1237.5 [I.V.:1237.5] Out: -  Intake/Output this shift:    Physical Exam:  Awake alert, oriented to name, [place], and March 2016. Following commands. Speech fluent. Pupils equal, round, reactive to light. EOMI. Moving all 4 extremity is well. No drift of upper extremities.  CBC  Recent Labs  08/25/14 1040 08/27/14 0414  WBC 7.0 7.6  HGB 13.8 13.5  HCT 38.3* 37.9*  PLT 98* 87*   BMET  Recent Labs  08/27/14 0414 08/28/14 0233  NA 129* 129*  K 3.5 3.4*  CL 96 95*  CO2 27 28  GLUCOSE 126* 118*  BUN 10 7  CREATININE 0.58 0.59  CALCIUM 7.8* 7.9*    Assessment/Plan: Patient neurologically stable. Yesterday repeatedly asked the same question, not recalling the answer that had just been given. Today not asking repeated questions.  We'll ask patient be seen by OT and speech therapy for cognitive evaluation. PT already initiated. We'll need assessment from therapy services about whether patient will be best managed with referral for inpatient rehabilitation in [place] or outpatient rehabilitation [place] by his local neurologist.  Continues on cardiac monitor. Continues to be followed by Dr. Graciela HusbandsKlein from electrophysiologic cardiology. We'll need Dr. Odessa FlemingKlein's recommendations about patient's needs prior to  discharge/transfer, as well as potential needs subsequently upon his return to [place].   Hewitt ShortsNUDELMAN,[name], MD 08/28/2014, 7:20 AM

## 2014-08-28 NOTE — Progress Notes (Signed)
Rehab Admissions Coordinator Note:  Patient was screened by Robyn Galati L for appropriateness for an Inpatient Acute Rehab Consult.  At this time, we are recommending Inpatient Rehab consult.  Mitsue Peery L 08/28/2014, 2:25 PM  I can be reached at [phone].

## 2014-08-28 NOTE — Progress Notes (Signed)
Pt arrived to 4N26 alert and oriented x4. Oriented to room. Tele started, CCMD notified. Questions answered. Will continue to monitor. Gara KronerHayles, Lakeitha Basques M, RN

## 2014-08-28 NOTE — Evaluation (Signed)
Occupational Therapy Evaluation Patient Details Name: [name] MRN: [id number] DOB: Sep 03, 1950 Today's Date: 08/28/2014    History of Present Illness Pt had syncopal episode as he was checking out from hotel and fell onto marble and sustained a occipatal fracture, SDH and epidural hematoma.    Clinical Impression   Pt presents at a min assist level for dynamic balance and selfcare tasks.  Min guard assist for sit to stand and static standing with simulated tasks.  Decreased anticipatory awareness and understanding of his cognitive deficits.  Feel he will benefit from acute care OT to help increase overall independence and increase awareness and cognition as it relates to ADL and IADL function.  Pt lives with his wife who reports that she cannot provide initial 24 hour supervision based on her job as a Warden/rangerpsychologist.  Strongly feel that pt is a fall risk at this time and will need 24 hour supervision if discharged back home to [place].  Feel he may benefit from short intensive CIR stay to further increase independence.  If CIR not approved feel outpatient is a must.      Follow Up Recommendations  CIR    Equipment Recommendations  Tub/shower seat    Recommendations for Other Services       Precautions / Restrictions Precautions Precautions: Fall Restrictions Weight Bearing Restrictions: No      Mobility Bed Mobility                  Transfers Overall transfer level: Needs assistance   Transfers: Stand Pivot Transfers Sit to Stand: Min guard Stand pivot transfers: Min guard;Min assist       General transfer comment: Pt with LOB to the right with dynamic standing tasks.    Balance Overall balance assessment: Needs assistance   Sitting balance-Leahy Scale: Good       Standing balance-Leahy Scale: Poor Standing balance comment: Pt with LOB during mobility and higher level tasks involving head movements with mobility                              ADL Overall ADL's : Needs assistance/impaired Eating/Feeding: Independent   Grooming: Standing;Min guard   Upper Body Bathing: Sitting;Set up   Lower Body Bathing: Min guard;Sit to/from stand   Upper Body Dressing : Supervision/safety;Sitting   Lower Body Dressing: Min guard;Sit to/from stand   Toilet Transfer: Minimal Holiday representativeassistance;Regular Toilet;Ambulation Toilet Transfer Details (indicate cue type and reason): simulated transfer Toileting- Clothing Manipulation and Hygiene: Sit to/from stand;Min guard       Functional mobility during ADLs: Minimal assistance General ADL Comments: Pt with occasional LOB to the right with mobility requiring min assist to correct, without use of an assistive device.  Feel pt needs extensive work on attention, cognitive processing with daily tasks, as well as balance. Discussed with pt's wife ways that she can help increase his cognitive processing by helping ask questions about the days prior events as well as questions about television shows, ect.  Feel he would benefit from short term inpatient rehab to progress to modified independent level.  Wife reports that she is still working and cannot provide 24 hour supervision immediately because of her practice as a Warden/rangerpsychologist.       Vision Vision Assessment?: Yes;No apparent visual deficits Eye Alignment: Within Functional Limits Ocular Range of Motion: Within Functional Limits Tracking/Visual Pursuits: Able to track stimulus in all quads without difficulty Saccades: Within functional limits Convergence:  Within functional limits Visual Fields: No apparent deficits Additional Comments: Vision to continue to be assessed in functional treatment   Perception Perception Perception Tested?: Yes   Praxis Praxis Praxis tested?: Within functional limits    Pertinent Vitals/Pain Pain Assessment: Faces Pain Score: 5  Faces Pain Scale: Hurts little more Pain Location: headache Pain Descriptors /  Indicators: Headache Pain Intervention(s): Monitored during session;Limited activity within patient's tolerance;Other (comment) (RN aware)     Hand Dominance Right   Extremity/Trunk Assessment Upper Extremity Assessment Upper Extremity Assessment: Overall WFL for tasks assessed   Lower Extremity Assessment Lower Extremity Assessment: Defer to PT evaluation   Cervical / Trunk Assessment Cervical / Trunk Assessment: Normal   Communication Communication Communication: No difficulties   Cognition Arousal/Alertness: Awake/alert Behavior During Therapy: WFL for tasks assessed/performed Overall Cognitive Status: Impaired/Different from baseline Area of Impairment: Safety/judgement;Awareness     Memory: Decreased short-term memory   Safety/Judgement: Decreased awareness of safety Awareness: Intellectual   General Comments: Pt initially unable to state balance deficits, but once ambulation with occasional LOB he was able to recognize them.              Home Living Family/patient expects to be discharged to:: Private residence Living Arrangements: Spouse/significant other;Children Available Help at Discharge: Family;Available PRN/intermittently Type of Home: House Home Access: Elevator     Home Layout: One level     Bathroom Shower/Tub: Tub/shower unit;Walk-in shower Shower/tub characteristics: Engineer, building services: Pharmacist, community: Yes   Home Equipment: None      Lives With: Spouse    Prior Functioning/Environment Level of Independence: Independent        Comments: works full time and was training for a marathon    OT Diagnosis: Generalized weakness;Cognitive deficits;Acute pain   OT Problem List: Decreased strength;Decreased activity tolerance;Impaired balance (sitting and/or standing);Decreased safety awareness;Pain;Decreased cognition;Decreased knowledge of use of DME or AE   OT Treatment/Interventions: Self-care/ADL training;Balance  training;DME and/or AE instruction;Cognitive remediation/compensation;Neuromuscular education;Patient/family education;Therapeutic activities    OT Goals(Current goals can be found in the care plan section)    OT Frequency: Min 3X/week   Barriers to D/C: Decreased caregiver support  pt's wife reports that she cannot provide 24 hour supervison initially          End of Session Nurse Communication: Mobility status  Activity Tolerance: Patient limited by pain Patient left: in chair;with call bell/phone within reach;with family/visitor present   Time: 1610-9604 OT Time Calculation (min): 64 min Charges:  OT General Charges $OT Visit: 1 Procedure OT Evaluation $Initial OT Evaluation Tier I: 1 Procedure OT Treatments $Self Care/Home Management : 38-52 mins  Khallid Pasillas OTR/L 08/28/2014, 12:24 PM

## 2014-08-29 ENCOUNTER — Telehealth: Payer: Self-pay | Admitting: Internal Medicine

## 2014-08-29 ENCOUNTER — Inpatient Hospital Stay (HOSPITAL_COMMUNITY): Payer: Managed Care, Other (non HMO)

## 2014-08-29 LAB — BASIC METABOLIC PANEL
Anion gap: 7 (ref 5–15)
BUN: 5 mg/dL — ABNORMAL LOW (ref 6–23)
CO2: 29 mmol/L (ref 19–32)
Calcium: 8.2 mg/dL — ABNORMAL LOW (ref 8.4–10.5)
Chloride: 91 mmol/L — ABNORMAL LOW (ref 96–112)
Creatinine, Ser: 0.53 mg/dL (ref 0.50–1.35)
GFR calc Af Amer: >90 mL/min (ref >90)
GFR calc non Af Amer: >90 mL/min (ref >90)
Glucose, Bld: 110 mg/dL — ABNORMAL HIGH (ref 70–99)
Potassium: 3.3 mmol/L — ABNORMAL LOW (ref 3.5–5.1)
Sodium: 127 mmol/L — ABNORMAL LOW (ref 135–145)

## 2014-08-29 MED ORDER — POTASSIUM CHLORIDE CRYS ER 20 MEQ PO TBCR
40.0000 meq | EXTENDED_RELEASE_TABLET | Freq: Once | ORAL | Status: AC
  Administered 2014-08-29: 40 meq via ORAL
  Filled 2014-08-29: qty 2

## 2014-08-29 NOTE — Telephone Encounter (Signed)
Left message explaining Dr. Graciela HusbandsKlein will do his best to stop by there before tomorrow morning.  If he is unable, then please drop it by our office and left address on voicemail

## 2014-08-29 NOTE — Progress Notes (Signed)
CM CONSULT Talked to patient with spouse present about DCP; patient plans to return [place] at discharge [place]. PCP - Dr Theophilus BonesJose G Lampreabe Neurologist - Dr Villa Herbavid Adams [phone] Cardiologist Dr Faythe CasaEugene Sayfie [phone]  TCT Neurologist to discuss where to send the patient in [place] for Inpatient vs Outpatient Therapy when the patient is medically ready for discharge; awaiting call back  Patient's home address [address] [place], [place] [id number]  Jiles CrockerBrenda Cher Franzoni RN,BSN,MHA [phone]

## 2014-08-29 NOTE — Progress Notes (Addendum)
Physical Therapy Treatment Patient Details Name: [name] MRN: [id number] DOB: Sep 14, 1950 Today's Date: 08/29/2014    History of Present Illness Pt had syncopal episode as he was checking out from hotel and fell onto marble and sustained a occipatal fracture, SDH and epidural hematoma.     PT Comments    Pt continues to have decr STM and difficulty with safety awareness. Recommend 24/7 (A) and someone with him for mobility at this time. May benefit from cane to balance upon D/C home. Recommend OPPT neuro for follow-up.   Follow Up Recommendations  Outpatient PT;Supervision/Assistance - 24 hour     Equipment Recommendations  Cane    Recommendations for Other Services       Precautions / Restrictions Precautions Precautions: Fall Restrictions Weight Bearing Restrictions: No    Mobility  Bed Mobility Overal bed mobility: Needs Assistance Bed Mobility: Supine to Sit     Supine to sit: Supervision     General bed mobility comments: pt utilizing handrails   Transfers Overall transfer level: Needs assistance Equipment used: 1 person hand held assist Transfers: Sit to/from Stand Sit to Stand: Min guard         General transfer comment: pt almost sat into floor vs sitting on toilet; decr safety awareness throughout session; handheld (A) to balance   Ambulation/Gait Ambulation/Gait assistance: Min assist Ambulation Distance (Feet): 350 Feet Assistive device: 1 person hand held assist Gait Pattern/deviations: Step-through pattern;Decreased stride length;Shuffle;Staggering right Gait velocity: decr Gait velocity interpretation: Below normal speed for age/gender General Gait Details: pt with multiple bouts of balance loss; min handheld (A) to steady; pt with difficulty navigating hallway and c/o incr pain when performing head turns   Stairs            Wheelchair Mobility    Modified Rankin (Stroke Patients Only)       Balance Overall balance  assessment: Needs assistance Sitting-balance support: No upper extremity supported;Feet supported Sitting balance-Leahy Scale: Good     Standing balance support: During functional activity;Single extremity supported Standing balance-Leahy Scale: Poor Standing balance comment: UE support required especially when sequencing ADLs in bathroom; pt with + sway                     Cognition Arousal/Alertness: Awake/alert Behavior During Therapy: WFL for tasks assessed/performed Overall Cognitive Status: Impaired/Different from baseline Area of Impairment: Safety/judgement;Awareness;Memory     Memory: Decreased short-term memory   Safety/Judgement: Decreased awareness of safety Awareness: Intellectual   General Comments: pt unable to recall word association and had difficulty navigating hallway and back to room     Exercises      General Comments General comments (skin integrity, edema, etc.): deferred stair ambulation due to pain      Pertinent Vitals/Pain Pain Assessment: 0-10 Pain Score: 8  Pain Location: "headache"  Pain Descriptors / Indicators: Headache Pain Intervention(s): Monitored during session;Premedicated before session;Repositioned    Home Living                      Prior Function            PT Goals (current goals can now be found in the care plan section) Acute Rehab PT Goals Patient Stated Goal: to go home today PT Goal Formulation: With patient Time For Goal Achievement: 09/03/14 Potential to Achieve Goals: Good Progress towards PT goals: Progressing toward goals    Frequency  Min 3X/week    PT Plan Discharge plan needs to  be updated    Co-evaluation             End of Session Equipment Utilized During Treatment: Gait belt Activity Tolerance: Patient tolerated treatment well Patient left: in chair;with chair alarm set;with family/visitor present     Time: [id number] PT Time Calculation (min) (ACUTE ONLY): 26  min  Charges:  Automatic Data$Gait Training: 23-37 mins                    G CodesDonell Sievert:      Aiman Noe N, South CarolinaPT  [phone] 08/29/2014, 10:21 AM

## 2014-08-29 NOTE — Telephone Encounter (Signed)
New message      [place] in the hosp [place]. Dr Graciela HusbandsKlein asked family to get info about doctor in [place] to coordinate care.  Calling to give Dr Graciela HusbandsKlein info about [place]. Dr Dennard NipEugene Sayie--[phone]; email address is [email or web address].  The family have the tilt table report.  [facility].  They would love to give you the tilt table report before pt leaves the hosp tomorrow.

## 2014-08-29 NOTE — Progress Notes (Signed)
Occupational Therapy Treatment Patient Details Name: Villa HerbStefan Verry MRN: [id number] DOB: 1950-08-10 Today's Date: 08/29/2014    History of present illness Pt had syncopal episode as he was checking out from hotel and fell onto marble and sustained a occipatal fracture, SDH and epidural hematoma.    OT comments  Pt still with decreased dynamic balance during selfcare tasks and transfers, ranging from supervision to min assist with LOB to the right side.  Decreased short term memory, safety awareness,  as well as divided attention.  Feel he will continue to benefit from acute OT with outpatient OT recommended [facility] once [facility] stable.   Follow Up Recommendations  Outpatient OT    Equipment Recommendations  Tub/shower seat       Precautions / Restrictions Precautions Precautions: Fall Restrictions Weight Bearing Restrictions: No       Mobility Bed Mobility                  Transfers Overall transfer level: Needs assistance   Transfers: Sit to/from Stand;Stand Pivot Transfers Sit to Stand: Min guard Stand pivot transfers: Min guard            Balance Overall balance assessment: Needs assistance   Sitting balance-Leahy Scale: Good       Standing balance-Leahy Scale: Poor Standing balance comment: Pt able to maintain standing balance statically at the sink with supervision but demonstrates LOB with dynamic movements/turning.                    ADL Overall ADL's : Needs assistance/impaired     Grooming: Wash/dry hands;Standing;Supervision/safety                   Toilet Transfer: Min guard;Comfort height toilet;Grab bars   Toileting- ArchitectClothing Manipulation and Hygiene: Min guard;Sit to/from stand         General ADL Comments: Pt moderate LOB to the right side when standing on the RLE and attempting to pull up his sock on the left.  Initially min assist for mobility in the hallway while dividing his attention between balance  and cognitive task.  He was able to recall 1/3 words after 10 minute delay.  He was able to count backwards from 100 by 3's with 90% accuracy and 75% accurate with namiing animals of the alphabet while ambulating.  Balance improved with further mobility to a min guard assist.  He was able to recall his room number and locate it using external aides.  Discussed further cognitive tasks with pt's daughter and wife that can be performed as well when he returns home. (sequencing selfcare tasks in a normal environment)                Cognition   Behavior During Therapy: Wellington Edoscopy CenterWFL for tasks assessed/performed Overall Cognitive Status: Impaired/Different from baseline Area of Impairment: Safety/judgement;Awareness;Memory;Attention   Current Attention Level: Divided;Alternating Memory: Decreased short-term memory    Safety/Judgement: Decreased awareness of safety Awareness: Intellectual   General Comments: decreased divided attention with regards to mobility and counting backwards, remembering words/names,                 Pertinent Vitals/ Pain       Pain Assessment: Faces Faces Pain Scale: Hurts a little bit Pain Location: headache Pain Descriptors / Indicators: Aching Pain Intervention(s): Monitored during session;Repositioned         Frequency Min 3X/week     Progress Toward Goals  OT Goals(current goals can now be  found in the care plan section)  Progress towards OT goals: Progressing toward goals     Plan Discharge plan needs to be updated       End of Session Equipment Utilized During Treatment: Gait belt   Activity Tolerance Patient tolerated treatment well   Patient Left in chair;with call bell/phone within reach;with chair alarm set;with family/visitor present   Nurse Communication Mobility status        Time: 1610-9604 OT Time Calculation (min): 50 min  Charges: OT General Charges $OT Visit: 1 Procedure OT Treatments $Self Care/Home Management : 8-22  mins $Cognitive Skills Development: 23-37 mins  Mahir Prabhakar OTR/L 08/29/2014, 3:37 PM

## 2014-08-29 NOTE — Progress Notes (Signed)
Talked to Attending MD, patient Neurologist is out of town and his Cardiologist in [place] will be following the patient post op; [facility], his Cardiologist will determine if the patient need inpt vs outpatient therapy; Alexis GoodellB Rhyatt Muska RN,BSN,MHA [phone]

## 2014-08-29 NOTE — Progress Notes (Signed)
Subjective: Patient sitting up in chair, eating lunch. Family and friend at bedside. Met with family and friend last night as well.  Continuing to work with PT today. CT the brain without contrast repeated earlier this morning, continued evolution of multiple hemorrhagic cerebral contusions, but beginning to improve. Patient continues to complain of diffuse cranial headache, understandable in the light of his extensive fracture the cranium as well as multiple cerebral contusions.  Patient's wife has been in touch with [facility] [employer], including the cardiologist has previously evaluated him. She has not been able to make contact with the neurologist who previously evaluated him, but explains to me that the cardiologist is willing to be the initial point person to get him in with the necessary medical staff at [facility].  Objective: Vital signs in last 24 hours: Filed Vitals:   08/28/14 2302 08/29/14 0121 08/29/14 0544 08/29/14 1012  BP: 141/80 142/83 151/81 139/78  Pulse: 50 50 56 55  Temp:  98 F (36.7 C) 98 F (36.7 C) 97.9 F (36.6 C)  TempSrc:  Oral Oral Oral  Resp: _0 Height:      Weight:      SpO2:  98% 99% 99%    Intake/Output from previous day: 03/08 0701 - 03/09 0700 In: 910 [P.O.:360; I.V.:550] Out: -  Intake/Output this shift: Total I/O In: 300 [P.O.:300] Out: -   Physical Exam:  Awake and alert, fully oriented. Speech fluent. Following commands. Pupils equal, round, reactive to light. EOMI. Facial movements symmetrical. Moving all 4 extremities well. No drift of upper extremities.  CBC  Recent Labs  08/27/14 0414  WBC 7.6  HGB 13.5  HCT 37.9*  PLT 87*   BMET  Recent Labs  08/28/14 0233 08/29/14 0649  NA 129* 127*  K 3.4* 3.3*  CL 95* 91*  CO2 28 29  GLUCOSE 118* 110*  BUN 7 5*  CREATININE 0.59 0.53  CALCIUM 7.9* 8.2*    Studies/Results: Ct Head Wo Contrast  08/29/2014   CLINICAL DATA:  Syncopal  episode at hotel, fell and hit [place], known skull fracture. Headache and head pain. Acute injury, follow-up evaluation.  EXAM: CT HEAD WITHOUT CONTRAST  TECHNIQUE: Contiguous axial images were obtained from the base of the skull through the vertex without intravenous contrast.  COMPARISON:  CT of the head August 25, 2014  FINDINGS: LEFT greater than RIGHT inferior frontal lobe hemorrhagic contusions, with mild interval loss ascending. Dominant 2.6 by 1.2 cm LEFT inferior frontal lobe hematomas relatively unchanged. Subcentimeter hematoma within LEFT hypothalamus. Similar to slightly worsening vasogenic edema associated with LEFT posterior temporal lobe hemorrhagic contusion.  Faint areas of subarachnoid hemorrhage in the parietal sulci, with stable 7 mm LEFT mesial parietal lobe hemorrhagic contusion. RIGHT frontotemporoparietal 6 mm hygroma with a focal 3 mm RIGHT frontal subdural hematoma, unchanged. Stable LEFT anterior cranial fossa 2-3 mm subdural hematoma. Stable 3 mm LEFT parietal occipital subdural hematoma. 1-2 mm LEFT parafalcine subdural hematoma, decreased. Small subdural hematoma at that convexities difficult to quantify due to axial imaging.  Trace density along RIGHT cerebellar tentorium likely reflects blood products. Basal cisterns are patent. No hydrocephalus. Cavum septum pellucidum. Layering blood products in the RIGHT occipital horn were present previously.  Depressed biparietal skull fracture with bony fragments displaced approximately 4 mm deep to the inner table. Fracture extends to the bifrontal calvarium at the vertex. Nondisplaced LEFT temporal bone fractures. Fracture extends into the LEFT mastoid air  cells with LEFT middle ear and mastoid blood products.  Mild paranasal sinus mucosal thickening without air-fluid levels. RIGHT mastoid air cells are well aerated. Ocular globes and orbital contents are unremarkable. Multifocal moderate scalp hematomas, slightly decreased.  IMPRESSION: Re-  demonstration of multi focal hemorrhagic contusions, including bifrontal lobes, LEFT temporal lobe, LEFT parietal lobe and, LEFT hypothalamus.  Scattered areas of subarachnoid blood was small degenerating subdural hematomas and RIGHT frontotemporoparietal slightly increased 6 mm hygroma.  Intraventricular blood products without hydrocephalus.  Multiple skull fractures including depressed biparietal skull fracture.   Electronically Signed   By: Courtnay  Bloomer   On: 08/29/2014 04:58    Assessment/Plan: Continued hyponatremia, with some hypokalemia. Continues on salt tablets, 1 g 3 times a day. We'll give KCl 40 mg by mouth now. We'll recheck BMET in a.m. Continuing therapies.  Spoke with the family and their friends both last night and today regarding our plans for further treatment and care. Their questions were answered for them.   [name],[name], MD 08/29/2014, 1:27 PM  

## 2014-08-30 LAB — BASIC METABOLIC PANEL
Anion gap: 8 (ref 5–15)
BUN: 8 mg/dL (ref 6–23)
CO2: 30 mmol/L (ref 19–32)
Calcium: 8.6 mg/dL (ref 8.4–10.5)
Chloride: 85 mmol/L — ABNORMAL LOW (ref 96–112)
Creatinine, Ser: 0.62 mg/dL (ref 0.50–1.35)
GFR calc Af Amer: >90 mL/min (ref >90)
GFR calc non Af Amer: >90 mL/min (ref >90)
Glucose, Bld: 104 mg/dL — ABNORMAL HIGH (ref 70–99)
Potassium: 3.5 mmol/L (ref 3.5–5.1)
Sodium: 123 mmol/L — ABNORMAL LOW (ref 135–145)

## 2014-08-30 MED ORDER — SODIUM CHLORIDE 1 G PO TABS
2.0000 g | ORAL_TABLET | Freq: Three times a day (TID) | ORAL | Status: DC
  Administered 2014-08-30 – 2014-08-31 (×3): 2 g via ORAL
  Filled 2014-08-30 (×5): qty 2

## 2014-08-30 MED ORDER — POTASSIUM CHLORIDE CRYS ER 20 MEQ PO TBCR
40.0000 meq | EXTENDED_RELEASE_TABLET | Freq: Once | ORAL | Status: AC
  Administered 2014-08-30: 40 meq via ORAL
  Filled 2014-08-30: qty 2

## 2014-08-30 NOTE — Progress Notes (Signed)
Called MD to inform of patient feeling very nauseated and dizzy and was told to continue to monitor. Suzy Bouchardhompson, Aaditya Letizia E, [place] 08/30/2014 5:28 AM

## 2014-08-30 NOTE — Progress Notes (Signed)
Patient ID: [name], male   DOB: 01-Jan-1951, 64 y.o.   MRN: [id number] BP 147/75 mmHg  Pulse 63  Temp(Src) 98.6 F (37 C) (Oral)  Resp 20  Ht 6\' 3"  (1.905 m)  Wt 70 kg (154 lb 5.2 oz)  BMI 19.29 kg/m2  SpO2 98% Alert and oriented, confused, repeats himself. Moving all extremities well perrl  Nauseated when walking earlier this morning, possible discharge tomorrow.

## 2014-08-30 NOTE — Progress Notes (Signed)
UR COMPLETED  

## 2014-08-31 LAB — BASIC METABOLIC PANEL
Anion gap: 11 (ref 5–15)
BUN: 10 mg/dL (ref 6–23)
CO2: 27 mmol/L (ref 19–32)
Calcium: 8.8 mg/dL (ref 8.4–10.5)
Chloride: 85 mmol/L — ABNORMAL LOW (ref 96–112)
Creatinine, Ser: 0.67 mg/dL (ref 0.50–1.35)
GFR calc Af Amer: >90 mL/min (ref >90)
GFR calc non Af Amer: >90 mL/min (ref >90)
Glucose, Bld: 111 mg/dL — ABNORMAL HIGH (ref 70–99)
Potassium: 4.1 mmol/L (ref 3.5–5.1)
Sodium: 123 mmol/L — ABNORMAL LOW (ref 135–145)

## 2014-08-31 MED ORDER — SODIUM CHLORIDE 1 G PO TABS: 2.0000 g | ORAL_TABLET | Freq: Three times a day (TID) | ORAL | Status: AC

## 2014-08-31 MED ORDER — PROCHLORPERAZINE 25 MG RE SUPP
25.0000 mg | Freq: Two times a day (BID) | RECTAL | Status: DC | PRN
  Administered 2014-08-31: 25 mg via RECTAL
  Filled 2014-08-31 (×2): qty 1

## 2014-08-31 MED ORDER — HYDROCODONE-ACETAMINOPHEN 5-325 MG PO TABS: 1.0000 | ORAL_TABLET | Freq: Four times a day (QID) | ORAL | Status: AC | PRN

## 2014-08-31 MED ORDER — PROMETHAZINE HCL 12.5 MG PO TABS: 12.5000 mg | ORAL_TABLET | Freq: Four times a day (QID) | ORAL | Status: AC | PRN

## 2014-08-31 NOTE — Progress Notes (Signed)
Pt is being discharged home. Discharge instructions were given to family

## 2014-08-31 NOTE — Discharge Summary (Signed)
Physician Discharge Summary  Patient ID: Villa HerbStefan [name] MRN: [id number] DOB/AGE: 02/20/1951 64 y.o.  Admit date: 08/24/2014 Discharge date: 08/31/2014  Admission Diagnoses:Syncope, skull fracture, Intracranial hemorrhage, close head injury  Discharge Diagnoses:  Active Problems:   Skull fracture   Syncope   Sinus bradycardia   Complete heart block   Discharged Condition: good  Hospital Course: Mr. [name] was admitted to the hospital after losing consciousness, falling, and finally striking his head. Mr. [name] is a 64 years old gentleman who was checking out the morning of admission from his hotel, fell and struck his head. Immediately, he complained of headache.  He was transferred to the emergency room.  By the time he came to the emergency room, according to the ER doctor, he was awake, talking, following commands.  Head ct showed multiple skull fractures, epidural and subdural blood, fairly classic contrecoup injury. During his hospitalization he has been hyponatremic, cachetic, and lethargic at times. He does follow all commands, will frequently repeat himself, is moving all extremities well, has very poor short term memory, and can be confused. He is below his cognitive baseline.  He [place] in [place], and his family will be driving back to [place]. If there are any questions please call [phone], [facility], and please ask for the Neurosurgeon on call. At discharge he is ambulating, voiding, and tolerating a regular diet. He remains hyponatremic at 123 today. The family has a disc containing all of his imaging during his stay at [facility], [place].Phone number([phone]) He does have a history of syncope which his cardiologist in [place] believes is related to his hyponatremia.    Consults: cardiology  Significant Diagnostic Studies:   Treatments: IV hydration  Discharge Exam: Blood pressure 138/72, pulse 70, temperature 98.2  F (36.8 C), temperature source Oral, resp. rate 20, height 6\' 3"  (1.905 m), weight 70 kg (154 lb 5.2 oz), SpO2 97 %. Alert, oriented to person, place, time. Poor short term memory.  No cranial nerve deficits(presumed anosmia secondary to injury) Symmetric facial sensation and movements. Full eom, perrl Hearing intact to voice Tongue and uvula midling Normal shoulder shrug Moving all extremities  Disposition: Final discharge disposition not confirmed     Medication List    STOP taking these medications        aspirin EC 81 MG tablet      TAKE these medications        dicyclomine 20 MG tablet  Commonly known as:  BENTYL  Take 20 mg by mouth every 6 (six) hours.     HYDROcodone-acetaminophen 5-325 MG per tablet  Commonly known as:  NORCO/VICODIN  Take 1-2 tablets by mouth every 6 (six) hours as needed for moderate pain.     promethazine 12.5 MG tablet  Commonly known as:  PHENERGAN  Take 1 tablet (12.5 mg total) by mouth every 6 (six) hours as needed for nausea or vomiting.     sodium chloride 1 G tablet  Take 2 tablets (2 g total) by mouth 3 (three) times daily with meals.         Signed: Ishitha Roper L 08/31/2014, 1:59 PM

## 2014-08-31 NOTE — Progress Notes (Signed)
Physical Therapy Treatment Patient Details Name: [name] MRN: [id number] DOB: 1951-02-02 Today's Date: 08/31/2014    History of Present Illness Pt had syncopal episode as he was checking out from hotel and fell onto marble and sustained a occipatal fracture, SDH and epidural hematoma.     PT Comments    Patient agreeable to ambulation. Patient still requiring Min A due to staggering with gait and dynamic balance gait deficits. Patient with decreased awareness and does not think that he needs assistance with mobility. Wife present and educated on all aspects of mobility and what to be looking for while walking with patient (dizziness/lightheadedness/SOB). Patient with flat affect but able to speak with wife in regards to safety and mobility as they plan to travel back to [place] later today and stop halfway to stay at hotel. Educated on tips to help ease the trip. Patient and patients wife will also have assistance from their two children on the way home. Wife is planning to follow up with MD and start OPPT as able. Continue to recommend OPPT for high level balance retraining.   Follow Up Recommendations  Outpatient PT;Supervision/Assistance - 24 hour     Equipment Recommendations  Cane    Recommendations for Other Services       Precautions / Restrictions Precautions Precautions: Fall    Mobility  Bed Mobility Overal bed mobility: Modified Independent                Transfers Overall transfer level: Needs assistance     Sit to Stand: Min guard            Ambulation/Gait Ambulation/Gait assistance: Min assist Ambulation Distance (Feet): 400 Feet Assistive device: None Gait Pattern/deviations: Step-through pattern;Decreased stride length;Scissoring;Staggering left;Staggering right Gait velocity: decr   General Gait Details: Patient with no LOB this session but continues to have staggering to both sides this session. Patient able to complete some head turns  with decreased gait speed as result. Patient stated that he does not seem to have impairments with ambulation.    Stairs Stairs: Yes Stairs assistance: Min assist Stair Management: Step to pattern;Forwards;Two rails Number of Stairs: 3 General stair comments: Min A to ensure balance. Use of bilateral rails for increased safety and balance  Wheelchair Mobility    Modified Rankin (Stroke Patients Only)       Balance     Sitting balance-Leahy Scale: Good       Standing balance-Leahy Scale: Fair Standing balance comment: LOB with dynamic activites.                     Cognition Arousal/Alertness: Awake/alert Behavior During Therapy: WFL for tasks assessed/performed Overall Cognitive Status: Impaired/Different from baseline Area of Impairment: Safety/judgement;Awareness     Memory: Decreased short-term memory   Safety/Judgement: Decreased awareness of safety     General Comments: difficulty remembeting words/names/numbers    Exercises      General Comments        Pertinent Vitals/Pain Pain Assessment: No/denies pain Pain Location: Patient stated that his "ears felt full" but denied pain    Home Living                      Prior Function            PT Goals (current goals can now be found in the care plan section) Progress towards PT goals: Progressing toward goals    Frequency  Min 3X/week    PT Plan  Current plan remains appropriate    Co-evaluation             End of Session Equipment Utilized During Treatment: Gait belt Activity Tolerance: Patient tolerated treatment well Patient left: in bed;with bed alarm set     Time: 1610-9604 PT Time Calculation (min) (ACUTE ONLY): 28 min  Charges:  $Gait Training: 8-22 mins $Therapeutic Exercise: 8-22 mins                    G Codes:      Fredrich Birks 08/31/2014, 8:57 AM 08/31/2014 Fredrich Birks PTA [phone] pager [phone] office

## 2014-08-31 NOTE — Progress Notes (Addendum)
Occupational Therapy Treatment Patient Details Name: [name] MRN: [id number] DOB: September 17, 1950 Today's Date: 08/31/2014    History of present illness Pt had syncopal episode as he was checking out from hotel and fell onto marble and sustained a occipatal fracture, SDH and epidural hematoma.    OT comments  Lengthy discussion with family regarding safety and preventing falls during functional tasks and during trip home. Recommended pt have 24/7 direct S at all times. Family verbalized understanding. Continue to recommend pt follow up with outpt OT at a neuro outpt center. Pt safe to D/C home with 24/7 S when medically stable.   Follow Up Recommendations  Outpatient OT;Other (comment) (neuro outpt)    Equipment Recommendations    shower seat   Recommendations for Other Services      Precautions / Restrictions Precautions Precautions: Fall       Mobility Bed Mobility Overal bed mobility: Modified Independent                Transfers Overall transfer level: Needs assistance Equipment used: 1 person hand held assist Transfers: Sit to/from Stand Sit to Stand: Min guard              Balance             Standing balance-Leahy Scale: Fair Standing balance comment: "swaying" when attending to task                   ADL Overall ADL's : Needs assistance/impaired     Grooming: Oral care;Cueing for safety;Standing Grooming Details (indicate cue type and reason): minguard for standing blance.   Pt required increased time for problem solving during grooming task. Pt looking at self in the mirror and could not understand why he had "black eyes". Unable to recall accident.                           Functional mobility during ADLs: Minimal assistance (unsteady at times; decreased balance when distracted) General ADL Comments: Lengthy discussion with family regarding need for 24/7 S after D/C home. Recommend family assist pt at all times with  mobility on his way home due to apparent balance deficits. Balance decreases as pt is distracted. Son verbalized understanding for need to be with his father physically during toilet transfers and toileting.       Vision                     Perception     Praxis  perseverated at times    Cognition   Behavior During Therapy: Flat affect Overall Cognitive Status: Impaired/Different from baseline Area of Impairment: Attention;Memory;Safety/judgement;Awareness;Problem solving   Current Attention Level: Alternating Memory: Decreased short-term memory    Safety/Judgement: Decreased awareness of safety;Decreased awareness of deficits Awareness: Intellectual Problem Solving: Slow processing;Requires verbal cues General Comments: Assessed with the MOCA. scored 20/30 with deficits with memory (delayed recall); verbal fluency and abstract reasoning.Discussed with family. Educated family on need to let "brain rest" in order to heal. Family asking about pt returning to work. Recommended refraining from working at this time and to discuss return to work with outpt therapists and his local MD.    Extremity/Trunk Assessment               Exercises     Shoulder Instructions       General Comments      Pertinent Vitals/ Pain       Pain  Assessment: Faces Faces Pain Scale: Hurts little more Pain Location: Head Pain Descriptors / Indicators: Grimacing Pain Intervention(s): Limited activity within patient's tolerance;Monitored during session  Home Living                                          Prior Functioning/Environment              Frequency Min 3X/week     Progress Toward Goals  OT Goals(current goals can now be found in the care plan section)  Progress towards OT goals: Progressing toward goals  Acute Rehab OT Goals Patient Stated Goal: to go home today ADL Goals Pt Will Perform Grooming: Independently;standing Pt Will Perform Upper  Body Bathing: sitting;standing;with modified independence Pt Will Perform Lower Body Bathing: with modified independence;sit to/from stand Pt Will Perform Upper Body Dressing: with modified independence;sitting Pt Will Perform Lower Body Dressing: with modified independence;sit to/from stand Pt Will Transfer to Toilet: with modified independence;regular height toilet;ambulating Pt Will Perform Toileting - Clothing Manipulation and hygiene: Independently;sit to/from stand Pt Will Perform Tub/Shower Transfer: ambulating;Shower transfer;shower seat;with modified independence Additional ADL Goal #1: Pt will demonstrate intellectual awareness of balance and cognitive deficits with no more than one questioning cue Additional ADL Goal #2: Pt will complete grooming, bathing tasks with no more than min questioning cues to sequence.   Plan Discharge plan remains appropriate    Co-evaluation                 End of Session Equipment Utilized During Treatment: Gait belt   Activity Tolerance Patient tolerated treatment well   Patient Left in chair;with call bell/phone within reach;with chair alarm set;with family/visitor present   Nurse Communication Mobility status        Time: [id number] OT Time Calculation (min): 50 min  Charges: OT General Charges $OT Visit: 1 Procedure OT Treatments $Self Care/Home Management : 8-22 mins $Therapeutic Activity: 23-37 mins  [name],[name] 08/31/2014, 1:29 PM   [facility] [name], OTR/L  [phone] 08/31/2014

## 2015-07-12 IMAGING — CT CT HEAD W/O CM
2 series · 16 of 30 positions shown, 18 images · non-contrast
Comparison: 08/24/2014

CLINICAL DATA: Head injury with skull fracture.  Initial encounter.

EXAM:
CT HEAD WITHOUT CONTRAST
TECHNIQUE: Contiguous axial images were obtained from the base of the skull
through the vertex without intravenous contrast.

[Series 201: head w/o, idose (1) · axial · non-contrast · 0.51mm/px · z∈[+99,+219]mm · 8 of 32 slices shown, 10 images]
[im 4/32  brain]
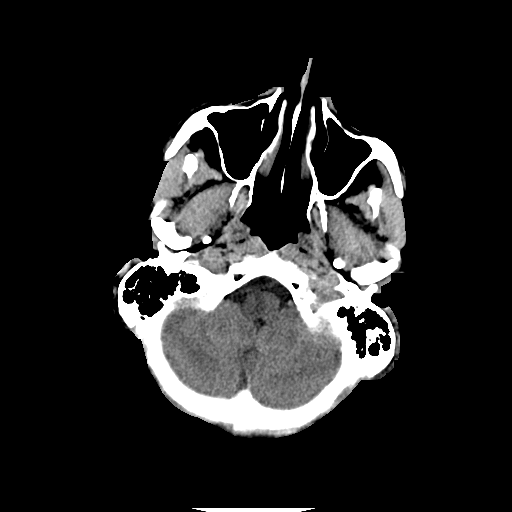
[im 4/32  bone]
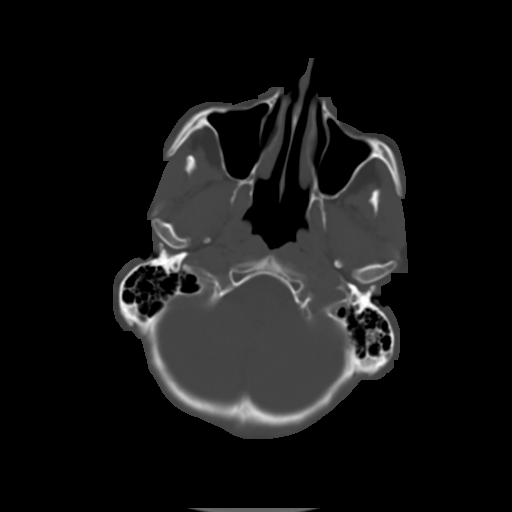
[im 7/32  brain]
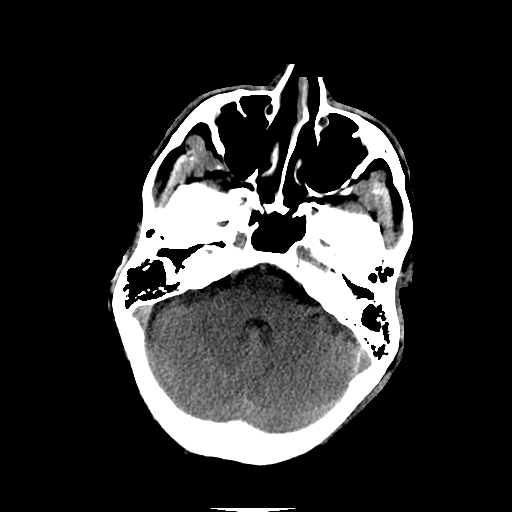
[im 11/32  brain]
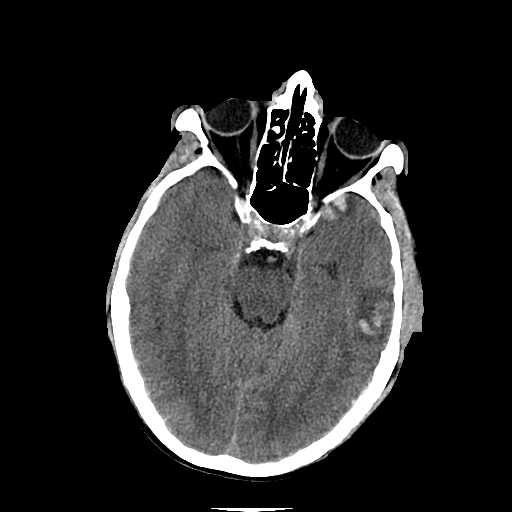
[im 14/32  brain]
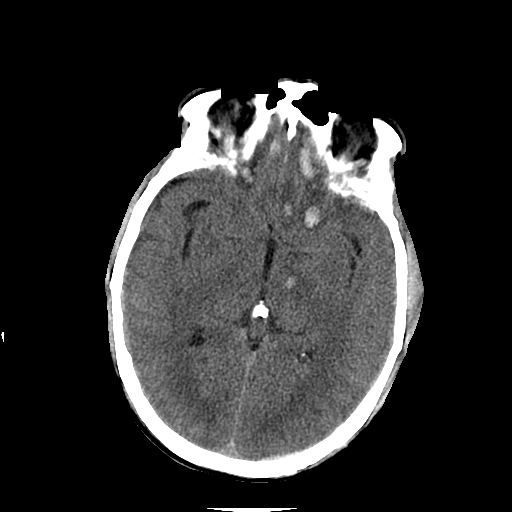
[im 18/32  brain]
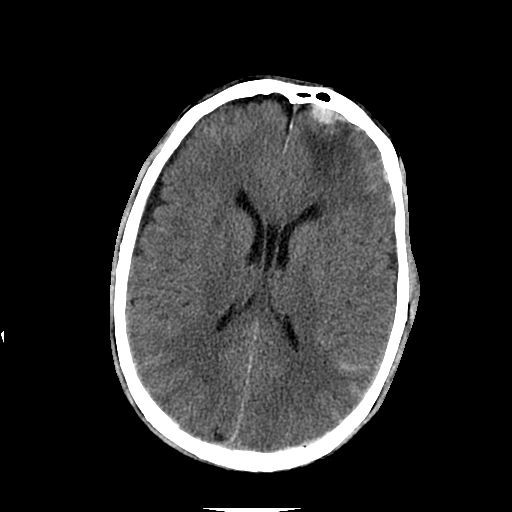
[im 18/32  bone]
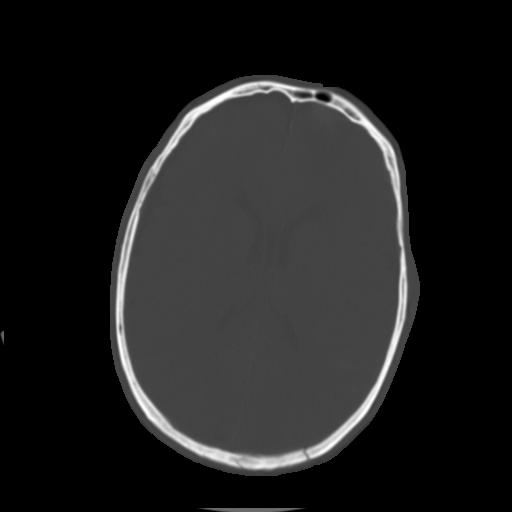
[im 21/32  brain]
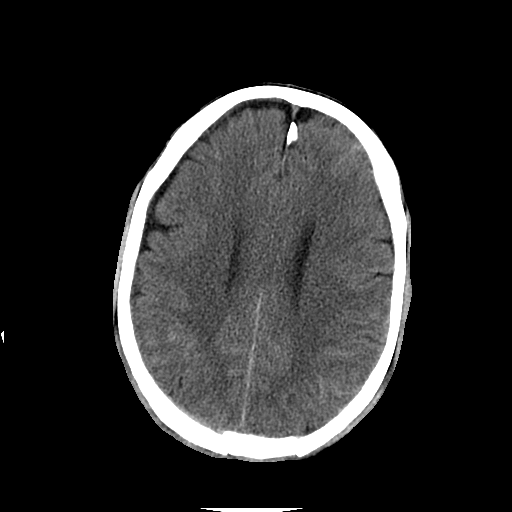
[im 25/32  brain]
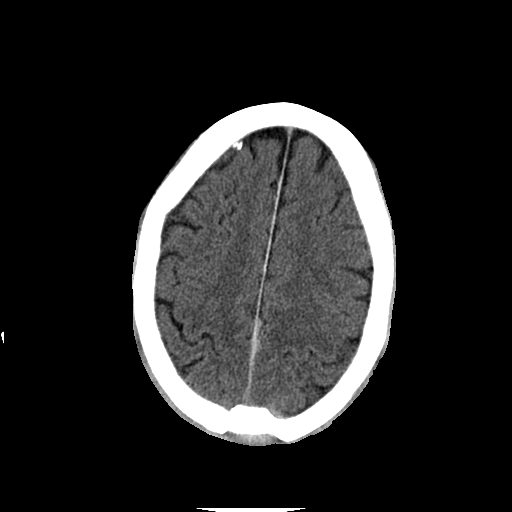
[im 28/32  brain]
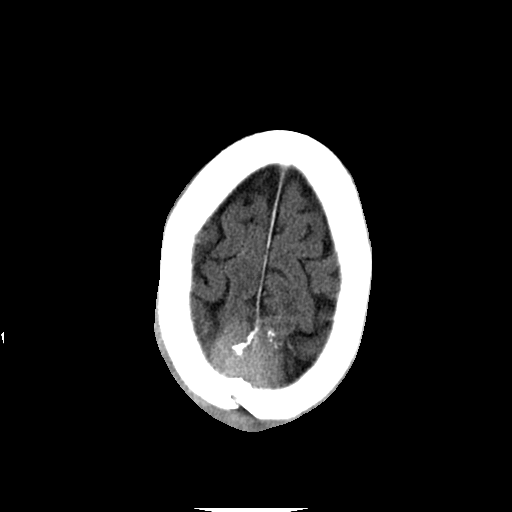

[Series 202: head w/o bone, idose (1) · axial · non-contrast · 0.51mm/px · z∈[+98,+223]mm · 8 of 64 slices shown]
[im 7/64  bone]
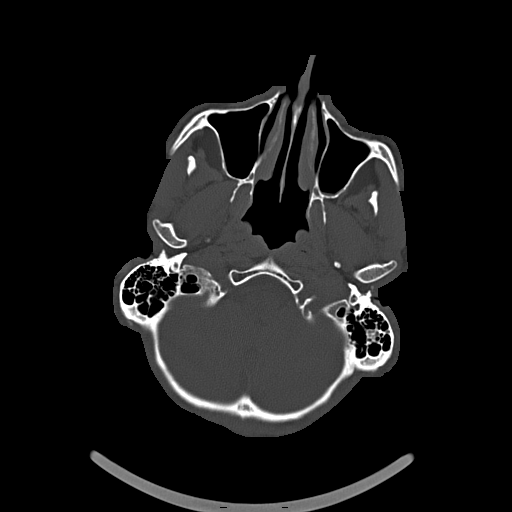
[im 14/64  bone]
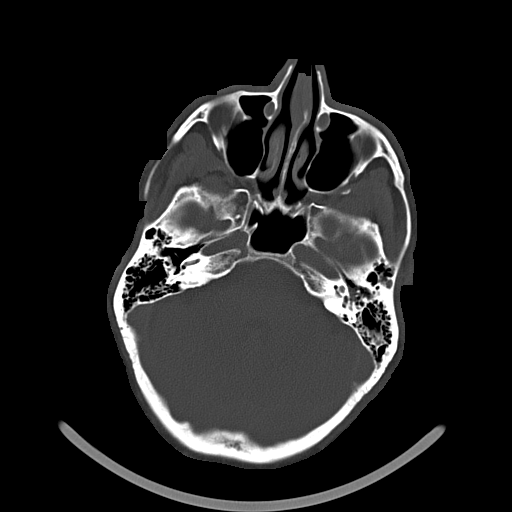
[im 20/64  bone]
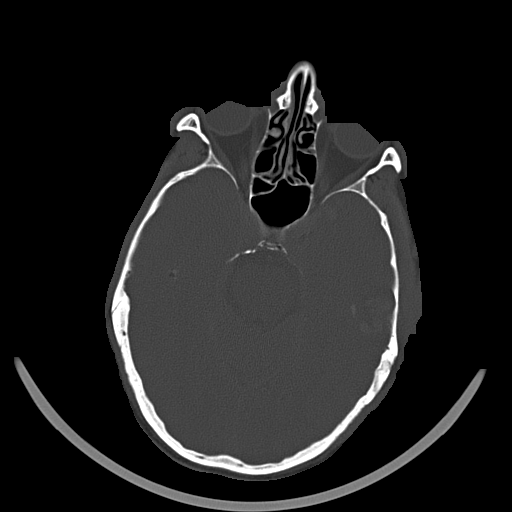
[im 27/64  bone]
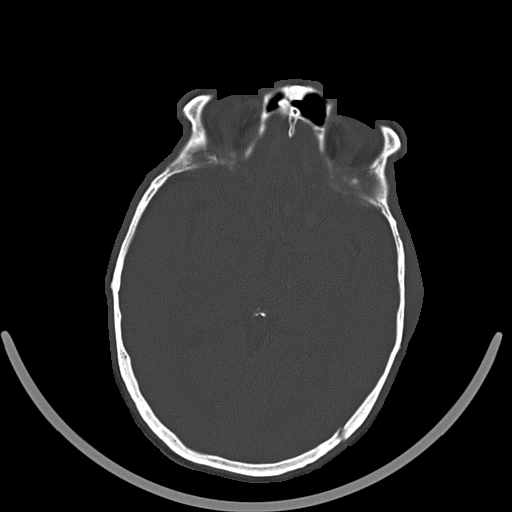
[im 37/64  bone]
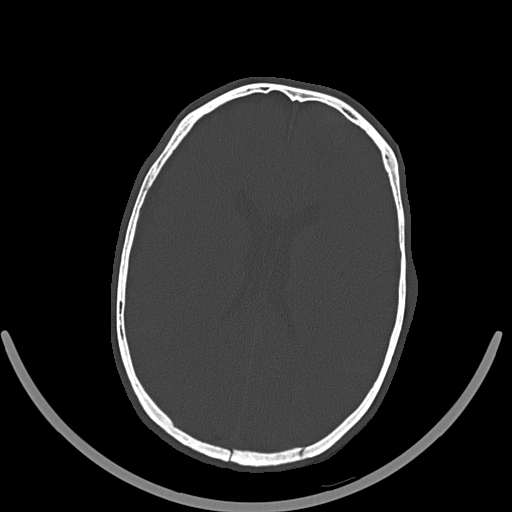
[im 44/64  bone]
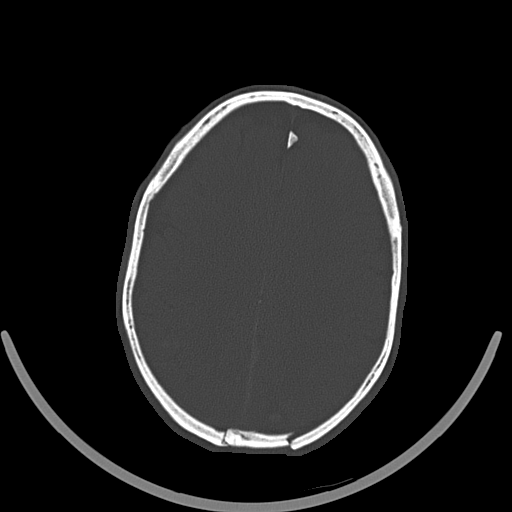
[im 50/64  bone]
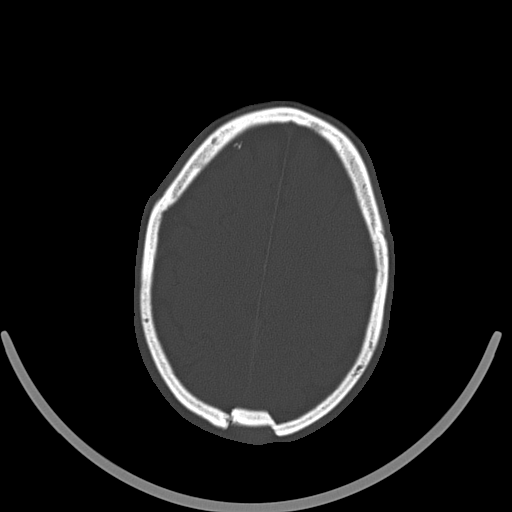
[im 57/64  bone]
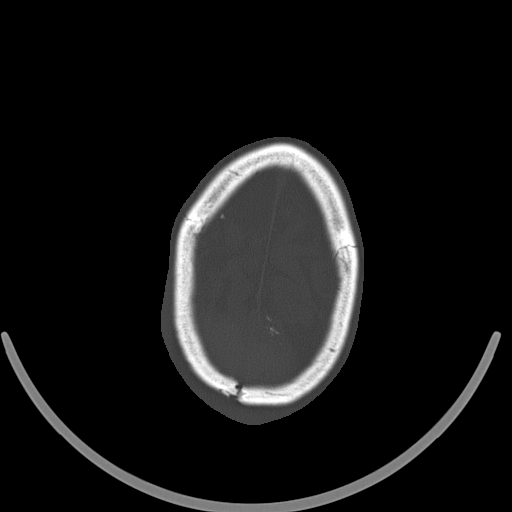

[16 of 30 positions shown; findings below may reference images not displayed]

FINDINGS: Complex parieto-occipital skull fracture is again seen with
approximately 6 mm depression of a central fragment in the midline
and suspected extension to the mastoid portion of the left temporal
bone, unchanged. Underlying epidural hematoma does not appear
significantly changed, greatest near the vertex.

There is a new, small amount of acute subdural hemorrhage along the
left posterior falx measuring 6 mm in thickness. A small amount of
acute subarachnoid hemorrhage is again noted scattered about the
cerebral hemispheres. There is a new 1 cm hemorrhagic contusion in
the medial left parietal lobe subjacent to the skull fracture. There
is also a new subcentimeter focus of hemorrhage involving the left
thalamus/ cerebral peduncle.

Foci of parenchymal hemorrhage in the inferior left frontal lobe
have increased in size and number, the largest measuring 2.7 x
cm with mild surrounding edema. There is overlying subarachnoid
hemorrhage and likely small volume subdural hemorrhage, similar to
prior. Trace right frontal subdural and/or subarachnoid hemorrhage
is unchanged. There is an inferior right frontal lobe contusion with
newly evident edema as well as a 9 mm focus of hemorrhage.

Hemorrhage in the left temporal lobe is similar to the prior study
with slightly increased edema. There is a new, small amount of
hemorrhage in the occipital horn of the right lateral ventricle.
There is no hydrocephalus. There is no midline shift. No acute large
territory infarct is identified.

A nondisplaced left orbital roof fracture is again questioned. Small
amount of left mastoid air cell fluid is unchanged. Right mastoid
air cells are clear. Visualized paranasal sinuses are clear. Scalp
swelling/ hematoma has mildly increased in size, particularly in the
left temporal region.
IMPRESSION: 1. Development of additional hemorrhagic contusions in the inferior
left frontal lobe.
2. New, small focus of hemorrhage in the left thalamus/cerebral
peduncle.
3. New/increased hemorrhagic contusion in the inferior right frontal
lobe.
4. New hemorrhagic contusion in the left parietal lobe.
5. Small volume subarachnoid hemorrhage, stable to slightly
increased, with new, small amount of intraventricular hemorrhage. No
hydrocephalus.
6. Slightly increased edema associated with the left temporal lobe
hemorrhagic contusion.
7. Unchanged epidural hematoma associated with complex
parieto-occipital skull fracture.
